# Patient Record
Sex: Female | Born: 1980 | Race: Black or African American | Hispanic: No | Marital: Single | Smoking: Never smoker
Health system: Southern US, Community
[De-identification: ages and names within clinical notes are randomized; demographics above are authoritative.]

## PROBLEM LIST (undated history)

## (undated) DIAGNOSIS — F32A Depression, unspecified: Secondary | ICD-10-CM

## (undated) DIAGNOSIS — C259 Malignant neoplasm of pancreas, unspecified: Secondary | ICD-10-CM

## (undated) DIAGNOSIS — G8929 Other chronic pain: Secondary | ICD-10-CM

## (undated) DIAGNOSIS — F329 Major depressive disorder, single episode, unspecified: Secondary | ICD-10-CM

## (undated) DIAGNOSIS — E7800 Pure hypercholesterolemia, unspecified: Secondary | ICD-10-CM

## (undated) DIAGNOSIS — E119 Type 2 diabetes mellitus without complications: Secondary | ICD-10-CM

## (undated) DIAGNOSIS — M1990 Unspecified osteoarthritis, unspecified site: Secondary | ICD-10-CM

## (undated) DIAGNOSIS — F419 Anxiety disorder, unspecified: Secondary | ICD-10-CM

## (undated) DIAGNOSIS — M797 Fibromyalgia: Secondary | ICD-10-CM

## (undated) DIAGNOSIS — I10 Essential (primary) hypertension: Secondary | ICD-10-CM

## (undated) DIAGNOSIS — K219 Gastro-esophageal reflux disease without esophagitis: Secondary | ICD-10-CM

## (undated) DIAGNOSIS — R040 Epistaxis: Secondary | ICD-10-CM

## (undated) HISTORY — DX: Depression, unspecified: F32.A

## (undated) HISTORY — DX: Malignant neoplasm of pancreas, unspecified: C25.9

## (undated) HISTORY — PX: PANCREATECTOMY: SHX1019

## (undated) HISTORY — PX: OTHER SURGICAL HISTORY: SHX169

## (undated) HISTORY — DX: Gastro-esophageal reflux disease without esophagitis: K21.9

## (undated) HISTORY — DX: Anxiety disorder, unspecified: F41.9

## (undated) HISTORY — DX: Fibromyalgia: M79.7

## (undated) HISTORY — DX: Pure hypercholesterolemia, unspecified: E78.00

## (undated) HISTORY — DX: Essential (primary) hypertension: I10

---
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
## 2017-06-11 ENCOUNTER — Ambulatory Visit: Payer: Self-pay | Admitting: Physician Assistant

## 2017-06-11 ENCOUNTER — Encounter: Payer: Self-pay | Admitting: Physician Assistant

## 2017-06-11 ENCOUNTER — Encounter (INDEPENDENT_AMBULATORY_CARE_PROVIDER_SITE_OTHER): Payer: Self-pay

## 2017-06-11 VITALS — BP 130/96 | HR 94 | Temp 97.9°F | Ht 61.0 in | Wt 242.0 lb

## 2017-06-11 DIAGNOSIS — L250 Unspecified contact dermatitis due to cosmetics: Secondary | ICD-10-CM

## 2017-06-11 DIAGNOSIS — F419 Anxiety disorder, unspecified: Secondary | ICD-10-CM

## 2017-06-11 DIAGNOSIS — R030 Elevated blood-pressure reading, without diagnosis of hypertension: Secondary | ICD-10-CM

## 2017-06-11 DIAGNOSIS — Z9109 Other allergy status, other than to drugs and biological substances: Secondary | ICD-10-CM

## 2017-06-11 NOTE — Congregational Nurse Program (Signed)
Congregational Nurse Program Note  Date of Encounter: 06/07/2017  Past Medical History: No past medical history on file.  Encounter Details:     CNP Questionnaire - 06/07/17 1311      Patient Demographics   Is this a new or existing patient? New   Patient is considered a/an Not Applicable   Race African-American/Black     Patient Assistance   Location of Patient Assistance [facility] [place]   Patient's financial/insurance status Low Income;Self-Pay (Uninsured)   Patient referred to apply for the following financial assistance Medicaid   Food insecurities addressed Referred to food bank or resource   Transportation assistance No   Assistance securing medications No   Educational health offerings Behavioral health;Chronic disease;Navigating the healthcare system     Encounter Details   Primary purpose of visit Chronic Illness/Condition Visit;Navigating the Healthcare System   Was an Emergency Department visit averted? Not Applicable   Does patient have a medical provider? No   Patient referred to [place] [facility]   Was a mental health screening completed? (GAINS tool) No   Does patient have dental issues? No   Does patient have vision issues? No   Does your patient have an abnormal blood pressure today? Yes   Since previous encounter, have you referred patient for abnormal blood pressure that resulted in a new diagnosis or medication change? No   Does your patient have an abnormal blood glucose today? No   Since previous encounter, have you referred patient for abnormal blood glucose that resulted in a new diagnosis or medication change? No   Was there a life-saving intervention made? No     New Client to the [facility]. Encountered client while doing blood pressure screenings at the [facility] in [place]. Client states she moved here from [place] and has not been able to get a primary care provider. She states she has little to no income and no health  insurance. She states she has not had any of her medications for about 6 months.  Discussed with client options for referral for primary medical care. She states due to location, [facility] her. Referral made and appointment secured for 06/11/17.  Client mentioned also some mental health issues, but would not discuss very much due to lack of privacy at the [facility].  Did let client know that we do have a Education officer, museum available and gave client contact information of RN so that she could call and schedule an appointment if she wanted for help with referral for other services such as mental health.  Encouraged client to keep appointment and contact information of the [facility] given to client.  Will follow as needed.

## 2017-06-11 NOTE — Progress Notes (Signed)
BP (!) 130/96 (BP Location: Left Arm, Patient Position: Sitting, Cuff Size: Large)   Pulse 94   Temp 97.9 F (36.6 C)   Ht 5\' 1"  (1.549 m)   Wt 242 lb (109.8 kg)   SpO2 99%   BMI 45.73 kg/m    Subjective:    Patient ID: Dominique Torres, female    DOB: 1981-04-06, 36 y.o.   MRN: [id number]  HPI: Dominique Torres is a 36 y.o. female presenting on 06/11/2017 for New Patient (Initial Visit) (pt just moved to [facility] from [place] a month ago. pt last seen PCP in [place] around January, 2018)   HPI  Chief Complaint  Patient presents with  . New Patient (Initial Visit)    pt just moved to [facility] from [place] a month ago. pt last seen PCP in [place] around January, 2018    Pt says she lost about 40 pounds and that has helped her bp.  She says she was never on medication for her bp  Pt states some stess recently with job and money issues.   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 Will plan to start pt screening at age 54.    Pt was on meds in the past that didn't work - Psychologist, clinical for her fibromyalgia.    Pt says she had allergies in the past but they have gotten worse since moving to [place].  Pt states rash on face started when she changed to a [place]onds cold cream.  She says it is improved a bit since she stopped it but it still is there some.   Relevant past medical, surgical, family and social history reviewed and updated as indicated. Interim medical history since our last visit reviewed. Allergies and medications reviewed and updated.    Current Outpatient Prescriptions:  .  fluticasone (FLONASE) 50 MCG/ACT nasal spray, Place into both nostrils daily., Disp: , Rfl:    Review of Systems  Constitutional: Negative for appetite change, chills, diaphoresis, fatigue, fever and unexpected weight change.  HENT: Negative for congestion, drooling, ear pain, facial swelling, hearing loss,  mouth sores, sneezing, sore throat, trouble swallowing and voice change.   Eyes: Negative for pain, discharge, redness, itching and visual disturbance.  Respiratory: Negative for cough, choking, shortness of breath and wheezing.   Cardiovascular: Negative for chest pain, palpitations and leg swelling.  Gastrointestinal: Negative for abdominal pain, blood in stool, constipation, diarrhea and vomiting.  Endocrine: Negative for cold intolerance, heat intolerance and polydipsia.  Genitourinary: Negative for decreased urine volume, dysuria and hematuria.  Musculoskeletal: Positive for arthralgias. Negative for back pain and gait problem.  Skin: Positive for rash.  Allergic/Immunologic: Positive for environmental allergies.  Neurological: Negative for seizures, syncope, light-headedness and headaches.  Hematological: Negative for adenopathy.  Psychiatric/Behavioral: Negative for agitation, dysphoric mood and suicidal ideas. The patient is nervous/anxious.     Per HPI unless specifically indicated above     Objective:    BP (!) 130/96 (BP Location: Left Arm, Patient Position: Sitting, Cuff Size: Large)   Pulse 94   Temp 97.9 F (36.6 C)   Ht 5\' 1"  (1.549 m)   Wt 242 lb (109.8 kg)   SpO2 99%   BMI 45.73 kg/m   Wt Readings from Last 3 Encounters:  06/11/17 242 lb (109.8 kg)    Physical Exam  Constitutional: She is oriented to person, place, and time. She  appears well-developed and well-nourished.  HENT:  Head: Normocephalic and atraumatic.  Right Ear: Hearing, tympanic membrane, external ear and ear canal normal.  Left Ear: Hearing, tympanic membrane, external ear and ear canal normal.  Nose: Nose normal.  Mouth/Throat: Uvula is midline and oropharynx is clear and moist. No oropharyngeal exudate.  Eyes: Pupils are equal, round, and reactive to light. Conjunctivae and EOM are normal.  Neck: Neck supple. No thyromegaly present.  Cardiovascular: Normal rate and regular rhythm.    Pulmonary/Chest: Effort normal and breath sounds normal. She has no wheezes.  Abdominal: Soft. Bowel sounds are normal. She exhibits no mass. There is no hepatosplenomegaly. There is no tenderness.  Musculoskeletal: She exhibits no edema.  Lymphadenopathy:    She has no cervical adenopathy.  Neurological: She is alert and oriented to person, place, and time. Gait normal.  Skin: Skin is warm and dry. Rash noted.  Rash on cheeks and a little bit of the forehead. No discrete lesions- mildly reddish with some hive like appearance  Psychiatric: She has a normal mood and affect. Her behavior is normal.  Vitals reviewed.   No results found for this or any previous visit.    Assessment & Plan:    Encounter Diagnoses  Name Primary?  Marland Kitchen Anxiety Yes  . Contact dermatitis due to cosmetics, unspecified contact dermatitis type   . Morbid obesity ([place])   . Elevated blood pressure reading   . Environmental allergies     -pt was given [facility] to call for counseling -Record request sent to previous PCP for recent labs, PAP report -pt counseled on allergies, can continue flonase, use claritin or similar as needed -counseled pt to avoid ponds or other cosmetics with fragrances, dyes.  Can use otc cortisone cream to affected area bid x 1 week.   -counseled pt to increase exercise/walking to 45-60 minutes at least 5 days/week to help bp, weight, anxiety -discussed screening- PAP not needed but q 3-5 years, will start colon screening age 35, mammogram age 58 or 65 (will revisit when the time comes).   -will await recent labs before ordering more -pt to follow up in one month to recheck bp, rash, records/labs.  Pt to RTO sooner prn

## 2017-07-12 ENCOUNTER — Ambulatory Visit: Payer: Self-pay | Admitting: Physician Assistant

## 2017-07-19 ENCOUNTER — Telehealth: Payer: Self-pay | Admitting: Student

## 2017-07-19 NOTE — Telephone Encounter (Signed)
LPN returning call to patient (pt left voicemail to be called back)  Pt is wanting help with anxiety. Pt states feeling like passing out, like her throat is closing, and is having headaches. pt states anxiety has been worsening and wants to know if [facility] can treat her or if she could be referred to some place that could help.  Pt was informed that at last OV on 06-11-17 she given card for [facility] and was recommended by PA to call them for counseling. Pt states she still has not called.  Pt was given [facility] number again as well as [facility]'s walk-in information. Pt was again instructed to call for counseling. Pt verbalized understanding.  Pt cancelled f/u appointment that was scheduled on 07-12-17 with [facility]. Pt has been rescheduled for 07-30-17.

## 2017-07-30 ENCOUNTER — Ambulatory Visit: Payer: Self-pay | Admitting: Physician Assistant

## 2017-07-30 ENCOUNTER — Other Ambulatory Visit (HOSPITAL_COMMUNITY)
Admission: RE | Admit: 2017-07-30 | Discharge: 2017-07-30 | Disposition: A | Discharge status: Home / Self Care | Payer: Self-pay | Source: Ambulatory Visit | Attending: Physician Assistant | Admitting: Physician Assistant

## 2017-07-30 ENCOUNTER — Encounter: Payer: Self-pay | Admitting: Physician Assistant

## 2017-07-30 VITALS — BP 134/82 | HR 97 | Temp 97.9°F | Ht 61.0 in | Wt 245.2 lb

## 2017-07-30 DIAGNOSIS — R030 Elevated blood-pressure reading, without diagnosis of hypertension: Secondary | ICD-10-CM

## 2017-07-30 DIAGNOSIS — F419 Anxiety disorder, unspecified: Secondary | ICD-10-CM | POA: Insufficient documentation

## 2017-07-30 DIAGNOSIS — Z130 Encounter for screening for diseases of the blood and blood-forming organs and certain disorders involving the immune mechanism: Secondary | ICD-10-CM | POA: Insufficient documentation

## 2017-07-30 DIAGNOSIS — Z13220 Encounter for screening for lipoid disorders: Secondary | ICD-10-CM

## 2017-07-30 DIAGNOSIS — Z131 Encounter for screening for diabetes mellitus: Secondary | ICD-10-CM

## 2017-07-30 DIAGNOSIS — G8929 Other chronic pain: Secondary | ICD-10-CM

## 2017-07-30 DIAGNOSIS — Z9109 Other allergy status, other than to drugs and biological substances: Secondary | ICD-10-CM

## 2017-07-30 LAB — CBC WITH DIFFERENTIAL/PLATELET
BASOS PCT: 0 %
Basophils Absolute: 0 10*3/uL (ref 0.0–0.1)
EOS ABS: 0.1 10*3/uL (ref 0.0–0.7)
Eosinophils Relative: 1 %
HEMATOCRIT: 38.5 % (ref 36.0–46.0)
Hemoglobin: 13.6 g/dL (ref 12.0–15.0)
LYMPHS ABS: 3.5 10*3/uL (ref 0.7–4.0)
Lymphocytes Relative: 39 %
MCH: 30.6 pg (ref 26.0–34.0)
MCHC: 35.3 g/dL (ref 30.0–36.0)
MCV: 86.5 fL (ref 78.0–100.0)
MONO ABS: 0.4 10*3/uL (ref 0.1–1.0)
Monocytes Relative: 5 %
Neutro Abs: 5 10*3/uL (ref 1.7–7.7)
Neutrophils Relative %: 55 %
Platelets: 313 10*3/uL (ref 150–400)
RBC: 4.45 MIL/uL (ref 3.87–5.11)
RDW: 12.9 % (ref 11.5–15.5)
WBC: 8.9 10*3/uL (ref 4.0–10.5)

## 2017-07-30 LAB — COMPREHENSIVE METABOLIC PANEL
ALT: 12 U/L — ABNORMAL LOW (ref 14–54)
ANION GAP: 11 (ref 5–15)
AST: 17 U/L (ref 15–41)
Albumin: 3.8 g/dL (ref 3.5–5.0)
Alkaline Phosphatase: 102 U/L (ref 38–126)
BILIRUBIN TOTAL: 0.5 mg/dL (ref 0.3–1.2)
BUN: 8 mg/dL (ref 6–20)
CALCIUM: 9.2 mg/dL (ref 8.9–10.3)
CHLORIDE: 99 mmol/L — AB (ref 101–111)
CO2: 28 mmol/L (ref 22–32)
CREATININE: 0.61 mg/dL (ref 0.44–1.00)
GFR calc Af Amer: >60 mL/min (ref >60)
GLUCOSE: 112 mg/dL — AB (ref 65–99)
Potassium: 3 mmol/L — ABNORMAL LOW (ref 3.5–5.1)
Sodium: 138 mmol/L (ref 135–145)
Total Protein: 8.6 g/dL — ABNORMAL HIGH (ref 6.5–8.1)

## 2017-07-30 LAB — HEMOGLOBIN A1C
HEMOGLOBIN A1C: 5.7 % — AB (ref 4.8–5.6)
MEAN PLASMA GLUCOSE: 116.89 mg/dL

## 2017-07-30 LAB — LIPID PANEL
CHOLESTEROL: 242 mg/dL — AB (ref 0–200)
HDL: 39 mg/dL — AB (ref >40)
LDL Cholesterol: 186 mg/dL — ABNORMAL HIGH (ref 0–99)
Total CHOL/HDL Ratio: 6.2 RATIO
Triglycerides: 85 mg/dL (ref <150)
VLDL: 17 mg/dL (ref 0–40)

## 2017-07-30 LAB — TSH: TSH: 1.608 u[IU]/mL (ref 0.350–4.500)

## 2017-07-30 NOTE — Progress Notes (Signed)
BP 134/82 (BP Location: Left Arm, Patient Position: Sitting, Cuff Size: Large)   Pulse 97   Temp 97.9 F (36.6 C)   Ht 5\' 1"  (1.549 m)   Wt 245 lb 4 oz (111.2 kg)   SpO2 97%   BMI 46.34 kg/m    Subjective:    Patient ID: Dominique Torres, female    DOB: [birth year], 36 y.o.   MRN: [id number]  HPI: Dominique Torres is a 36 y.o. female presenting on 07/30/2017 for Hypertension and Rash   HPI  Record request to previous PCP- no reply  Pt States anxiety worse past 2 wk.  Pt joined support group at [facility].  Pt states hx fibromyalgia in the past and states pain "really bad" recently.   When asked what she took for that in the past pt states she has many drug allergies and many meds didn't work.  She says she used muscle relaxers chronically.  Relevant past medical, surgical, family and social history reviewed and updated as indicated. Interim medical history since our last visit reviewed. Allergies and medications reviewed and updated.   Current Outpatient Prescriptions:  .  fluticasone (FLONASE) 50 MCG/ACT nasal spray, Place into both nostrils daily., Disp: , Rfl:  .  OVER THE COUNTER MEDICATION, Lobelia Liquid for anxiety and sleep, Disp: , Rfl:  .  VALERIAN PO, Take 500 mg by mouth daily., Disp: , Rfl:    Review of Systems  Constitutional: Positive for fatigue. Negative for appetite change, chills, diaphoresis, fever and unexpected weight change.  HENT: Negative for congestion, dental problem, drooling, ear pain, facial swelling, hearing loss, mouth sores, sneezing, sore throat, trouble swallowing and voice change.   Eyes: Negative for pain, discharge, redness, itching and visual disturbance.  Respiratory: Negative for cough, choking, shortness of breath and wheezing.   Cardiovascular: Negative for chest pain, palpitations and leg swelling.  Gastrointestinal: Negative for abdominal pain, blood in stool, constipation, diarrhea and vomiting.  Endocrine: Positive for cold  intolerance and heat intolerance. Negative for polydipsia.  Genitourinary: Negative for decreased urine volume, dysuria and hematuria.  Musculoskeletal: Positive for arthralgias and gait problem. Negative for back pain.  Skin: Negative for rash.  Allergic/Immunologic: Negative for environmental allergies.  Neurological: Positive for headaches. Negative for seizures, syncope and light-headedness.  Hematological: Negative for adenopathy.  Psychiatric/Behavioral: Positive for dysphoric mood. Negative for agitation and suicidal ideas. The patient is nervous/anxious.     Per HPI unless specifically indicated above     Objective:    BP 134/82 (BP Location: Left Arm, Patient Position: Sitting, Cuff Size: Large)   Pulse 97   Temp 97.9 F (36.6 C)   Ht 5\' 1"  (1.549 m)   Wt 245 lb 4 oz (111.2 kg)   SpO2 97%   BMI 46.34 kg/m   Wt Readings from Last 3 Encounters:  07/30/17 245 lb 4 oz (111.2 kg)  06/11/17 242 lb (109.8 kg)    Physical Exam  Constitutional: She is oriented to person, place, and time. She appears well-developed and well-nourished.  HENT:  Head: Normocephalic and atraumatic.  Neck: Neck supple.  Cardiovascular: Normal rate and regular rhythm.   Pulmonary/Chest: Effort normal and breath sounds normal.  Abdominal: Soft. Bowel sounds are normal. She exhibits no mass. There is no hepatosplenomegaly. There is no tenderness.  Musculoskeletal: She exhibits no edema.  Lymphadenopathy:    She has no cervical adenopathy.  Neurological: She is alert and oriented to person, place, and time.  Skin: Skin is warm and dry.  Psychiatric: She has a normal mood and affect. Her behavior is normal.  Vitals reviewed.   No results found for this or any previous visit.    Assessment & Plan:   Encounter Diagnoses  Name Primary?  Marland Kitchen Anxiety Yes  . Elevated blood pressure reading   . Environmental allergies   . Morbid obesity ([place])   . Other chronic pain   . Screening cholesterol level    . Screening, anemia, deficiency, iron   . Screening for diabetes mellitus     -Order baseline labs -Re-send record request for last PAP results  -Refer for chronic pain -monitor BP [facility] for anxiety -pt was given [facility] discount application -follow up 1 month  RTO sooner prn

## 2017-07-31 ENCOUNTER — Other Ambulatory Visit: Payer: Self-pay | Admitting: Physician Assistant

## 2017-07-31 DIAGNOSIS — E876 Hypokalemia: Secondary | ICD-10-CM

## 2017-08-07 ENCOUNTER — Other Ambulatory Visit (HOSPITAL_COMMUNITY)
Admission: RE | Admit: 2017-08-07 | Discharge: 2017-08-07 | Disposition: A | Discharge status: Home / Self Care | Payer: Self-pay | Source: Ambulatory Visit | Attending: Physician Assistant | Admitting: Physician Assistant

## 2017-08-07 DIAGNOSIS — E876 Hypokalemia: Secondary | ICD-10-CM | POA: Insufficient documentation

## 2017-08-07 LAB — POTASSIUM: Potassium: 2.9 mmol/L — ABNORMAL LOW (ref 3.5–5.1)

## 2017-08-12 ENCOUNTER — Other Ambulatory Visit: Payer: Self-pay | Admitting: Physician Assistant

## 2017-08-12 DIAGNOSIS — E876 Hypokalemia: Secondary | ICD-10-CM

## 2017-08-12 MED ORDER — POTASSIUM CHLORIDE ER 10 MEQ PO TBCR: 10.0000 meq | EXTENDED_RELEASE_TABLET | Freq: Two times a day (BID) | ORAL | 0 refills | Status: DC

## 2017-08-21 ENCOUNTER — Other Ambulatory Visit: Payer: Self-pay | Admitting: Physician Assistant

## 2017-08-21 ENCOUNTER — Other Ambulatory Visit (HOSPITAL_COMMUNITY)
Admission: RE | Admit: 2017-08-21 | Discharge: 2017-08-21 | Disposition: A | Discharge status: Home / Self Care | Payer: Self-pay | Source: Ambulatory Visit | Attending: Physician Assistant | Admitting: Physician Assistant

## 2017-08-21 DIAGNOSIS — E876 Hypokalemia: Secondary | ICD-10-CM

## 2017-08-21 LAB — POTASSIUM: POTASSIUM: 3.1 mmol/L — AB (ref 3.5–5.1)

## 2017-08-21 MED ORDER — POTASSIUM CHLORIDE ER 10 MEQ PO TBCR: 10.0000 meq | EXTENDED_RELEASE_TABLET | Freq: Two times a day (BID) | ORAL | 0 refills | Status: DC

## 2017-09-03 ENCOUNTER — Ambulatory Visit: Payer: Self-pay | Admitting: Physician Assistant

## 2017-09-06 ENCOUNTER — Other Ambulatory Visit (HOSPITAL_COMMUNITY)
Admission: RE | Admit: 2017-09-06 | Discharge: 2017-09-06 | Disposition: A | Discharge status: Home / Self Care | Payer: Self-pay | Source: Ambulatory Visit | Attending: Physician Assistant | Admitting: Physician Assistant

## 2017-09-06 ENCOUNTER — Encounter: Payer: Self-pay | Admitting: Physician Assistant

## 2017-09-06 ENCOUNTER — Ambulatory Visit: Payer: Self-pay | Admitting: Physician Assistant

## 2017-09-06 VITALS — BP 156/88 | HR 104 | Temp 97.9°F | Wt 249.5 lb

## 2017-09-06 DIAGNOSIS — E785 Hyperlipidemia, unspecified: Secondary | ICD-10-CM

## 2017-09-06 DIAGNOSIS — R030 Elevated blood-pressure reading, without diagnosis of hypertension: Secondary | ICD-10-CM

## 2017-09-06 DIAGNOSIS — G8929 Other chronic pain: Secondary | ICD-10-CM

## 2017-09-06 DIAGNOSIS — E876 Hypokalemia: Secondary | ICD-10-CM

## 2017-09-06 DIAGNOSIS — R7303 Prediabetes: Secondary | ICD-10-CM

## 2017-09-06 LAB — POTASSIUM: POTASSIUM: 3.1 mmol/L — AB (ref 3.5–5.1)

## 2017-09-06 MED ORDER — POTASSIUM CHLORIDE ER 10 MEQ PO TBCR: 10.0000 meq | EXTENDED_RELEASE_TABLET | Freq: Two times a day (BID) | ORAL | 0 refills | Status: DC

## 2017-09-06 MED ORDER — ATORVASTATIN CALCIUM 10 MG PO TABS: 10.0000 mg | ORAL_TABLET | Freq: Every day | ORAL | 1 refills | Status: DC

## 2017-09-06 NOTE — Patient Instructions (Addendum)
-turn in [facility] application -[facility] for PAP [phone]) -Continue potassium -nurse will call you in 2 weeks to get labs drawn   Fat and Cholesterol Restricted Diet Getting too much fat and cholesterol in your diet may cause health problems. Following this diet helps keep your fat and cholesterol at normal levels. This can keep you from getting sick. What types of fat should I choose?  Choose monosaturated and polyunsaturated fats. These are found in foods such as olive oil, canola oil, flaxseeds, walnuts, almonds, and seeds.  Eat more omega-3 fats. Good choices include salmon, mackerel, sardines, tuna, flaxseed oil, and ground flaxseeds.  Limit saturated fats. These are in animal products such as meats, butter, and cream. They can also be in plant products such as palm oil, palm kernel oil, and coconut oil.  Avoid foods with partially hydrogenated oils in them. These contain trans fats. Examples of foods that have trans fats are stick margarine, some tub margarines, cookies, crackers, and other baked goods. What general guidelines do I need to follow?  Check food labels. Look for the words "trans fat" and "saturated fat."  When preparing a meal: ? Fill half of your plate with vegetables and green salads. ? Fill one fourth of your plate with whole grains. Look for the word "whole" as the first word in the ingredient list. ? Fill one fourth of your plate with lean protein foods.  Eat more foods that have fiber, like apples, carrots, beans, peas, and barley.  Eat more home-cooked foods. Eat less at restaurants and buffets.  Limit or avoid alcohol.  Limit foods high in starch and sugar.  Limit fried foods.  Cook foods without frying them. Baking, boiling, grilling, and broiling are all great options.  Lose weight if you are overweight. Losing even a small amount of weight can help your overall health. It can also help prevent diseases such as diabetes and  heart disease. What foods can I eat? Grains Whole grains, such as whole wheat or whole grain breads, crackers, cereals, and pasta. Unsweetened oatmeal, bulgur, barley, quinoa, or brown rice. Corn or whole wheat flour tortillas. Vegetables Fresh or frozen vegetables (raw, steamed, roasted, or grilled). Green salads. Fruits All fresh, canned (in natural juice), or frozen fruits. Meat and Other Protein Products Ground beef (85% or leaner), grass-fed beef, or beef trimmed of fat. Skinless chicken or [place]. Ground chicken or [place]. Pork trimmed of fat. All fish and seafood. Eggs. Dried beans, peas, or lentils. Unsalted nuts or seeds. Unsalted canned or dry beans. Dairy Low-fat dairy products, such as skim or 1% milk, 2% or reduced-fat cheeses, low-fat ricotta or cottage cheese, or plain low-fat yogurt. Fats and Oils Tub margarines without trans fats. Light or reduced-fat mayonnaise and salad dressings. Avocado. Olive, canola, sesame, or safflower oils. Natural peanut or almond butter (choose ones without added sugar and oil). The items listed above may not be a complete list of recommended foods or beverages. Contact your dietitian for more options. What foods are not recommended? Grains White bread. White pasta. White rice. Cornbread. Bagels, pastries, and croissants. Crackers that contain trans fat. Vegetables White potatoes. Corn. Creamed or fried vegetables. Vegetables in a cheese sauce. Fruits Dried fruits. Canned fruit in light or heavy syrup. Fruit juice. Meat and Other Protein Products Fatty cuts of meat. Ribs, chicken wings, bacon, sausage, bologna, salami, chitterlings, fatback, hot dogs, bratwurst, and packaged luncheon meats. Liver and organ meats. Dairy Whole or 2% milk, cream, half-and-half, and  cream cheese. Whole milk cheeses. Whole-fat or sweetened yogurt. Full-fat cheeses. Nondairy creamers and whipped toppings. Processed cheese, cheese spreads, or cheese curds. Sweets and  Desserts Corn syrup, sugars, honey, and molasses. Candy. Jam and jelly. Syrup. Sweetened cereals. Cookies, pies, cakes, donuts, muffins, and ice cream. Fats and Oils Butter, stick margarine, lard, shortening, ghee, or bacon fat. Coconut, palm kernel, or palm oils. Beverages Alcohol. Sweetened drinks (such as sodas, lemonade, and fruit drinks or punches). The items listed above may not be a complete list of foods and beverages to avoid. Contact your dietitian for more information. This information is not intended to replace advice given to you by your health care provider. Make sure you discuss any questions you have with your health care provider. Document Released: 04/16/2012 Document Revised: 06/22/2016 Document Reviewed: 01/15/2014 Elsevier Interactive Patient Education  [employer].

## 2017-09-06 NOTE — Progress Notes (Signed)
BP (!) 156/88   Pulse (!) 104   Temp 97.9 F (36.6 C)   Wt 249 lb 8 oz (113.2 kg)   SpO2 98%   BMI 47.14 kg/m    Subjective:    Patient ID: Dominique Torres, female    DOB: [birth year], 36 y.o.   MRN: [id number]  HPI: Dominique Torres is a 36 y.o. female presenting on 09/06/2017 for Follow-up and Nausea (pt states decrease appetite, weight gain, and constipation. pt believes it is due to potassium)   HPI  Chief Complaint  Patient presents with  . Follow-up  . Nausea    pt states decrease appetite, weight gain, and constipation. pt believes it is due to potassium    Pt did not contact [facility] for anxiety.  She "heard terrible things about that place".  She is trying to get in with somewhere else. She joined a trauma support group that helps some.   Pt stopped the supplements 08/13/17 when instructed to do so.  Pt did not get [facility] discount application turned in (for chronic pain referral)  Pt says that she now has [employer] Medicaid  Records back from her previous PCP- PAP not done 2017 when pelvic exam was done (pt says it was because she had yeast infection)  Relevant past medical, surgical, family and social history reviewed and updated as indicated. Interim medical history since our last visit reviewed. Allergies and medications reviewed and updated.   Current Outpatient Medications:  .  potassium chloride (K-DUR) 10 MEQ tablet, Take 1 tablet (10 mEq total) by mouth 2 (two) times daily., Disp: 28 tablet, Rfl: 0   Review of Systems  Constitutional: Positive for appetite change, fatigue and unexpected weight change. Negative for chills, diaphoresis and fever.  HENT: Negative for congestion, dental problem, drooling, ear pain, facial swelling, hearing loss, mouth sores, sneezing, sore throat, trouble swallowing and voice change.   Eyes: Negative for pain, discharge, redness, itching and visual disturbance.  Respiratory: Negative for cough, choking, shortness of breath  and wheezing.   Cardiovascular: Negative for chest pain, palpitations and leg swelling.  Gastrointestinal: Positive for constipation. Negative for abdominal pain, blood in stool, diarrhea and vomiting.  Endocrine: Positive for cold intolerance and heat intolerance. Negative for polydipsia.  Genitourinary: Negative for decreased urine volume, dysuria and hematuria.  Musculoskeletal: Positive for arthralgias, back pain and gait problem.  Skin: Negative for rash.  Allergic/Immunologic: Negative for environmental allergies.  Neurological: Negative for seizures, syncope, light-headedness and headaches.  Hematological: Negative for adenopathy.  Psychiatric/Behavioral: Positive for dysphoric mood. Negative for agitation and suicidal ideas. The patient is nervous/anxious.     Per HPI unless specifically indicated above     Objective:    BP (!) 156/88   Pulse (!) 104   Temp 97.9 F (36.6 C)   Wt 249 lb 8 oz (113.2 kg)   SpO2 98%   BMI 47.14 kg/m   Wt Readings from Last 3 Encounters:  09/06/17 249 lb 8 oz (113.2 kg)  07/30/17 245 lb 4 oz (111.2 kg)  06/11/17 242 lb (109.8 kg)    Physical Exam  Constitutional: She is oriented to person, place, and time. She appears well-developed and well-nourished.  HENT:  Head: Normocephalic and atraumatic.  Neck: Neck supple.  Cardiovascular: Normal rate and regular rhythm.  Pulmonary/Chest: Effort normal and breath sounds normal.  Abdominal: Soft. Bowel sounds are normal. She exhibits no mass. There is no hepatosplenomegaly. There is no tenderness.  Musculoskeletal: She exhibits no edema.  Lymphadenopathy:    She has no cervical adenopathy.  Neurological: She is alert and oriented to person, place, and time.  Skin: Skin is warm and dry.  Psychiatric: She has a normal mood and affect. Her behavior is normal.  Vitals reviewed.   Results for orders placed or performed during the hospital encounter of 09/06/17  Potassium  Result Value Ref Range    Potassium 3.1 (L) 3.5 - 5.1 mmol/L      Assessment & Plan:   Encounter Diagnoses  Name Primary?  . Hypokalemia Yes  . Hyperlipidemia, unspecified hyperlipidemia type   . Elevated blood pressure reading   . Morbid obesity ([place])     -reviewed labs with pt -Pt to call gyn for PAP (she has FP medicaid) -pt counseled to Turn in [employer] application for pain referral -pt to Continue potassium -will recheck potassium in 2 wk -will sign up pt for [facility] -( potassium and lipitor) -pt counseled about weight management and prediabetes -pt to follow up 1 month to recheck bp (it was good at previous OV).  RTO sooner prn

## 2017-09-13 ENCOUNTER — Telehealth: Payer: Self-pay

## 2017-09-13 NOTE — Telephone Encounter (Signed)
Telephone call today to remind patient needs to get potassium rechecked next week. Patient states she has not yet started taking potassium due to cannot afford it, even with coupon provided at last visit. Will bring papers for [facility] ASAP so can get med through [facility]. Discussed with PA, with patient given instructions that her potassium level is life-threatening, and she needs to get blood drawn today, and start taking potassium as ordered. Patient states she cannot go today, due to fibromyalgia pain, but that she will be able to go tomorrow. Patient states understanding of importance of getting lab drawn ASAP and starting potassium as ordered.

## 2017-09-14 ENCOUNTER — Other Ambulatory Visit (HOSPITAL_COMMUNITY)
Admission: RE | Admit: 2017-09-14 | Discharge: 2017-09-14 | Disposition: A | Discharge status: Home / Self Care | Payer: Self-pay | Source: Ambulatory Visit | Attending: Physician Assistant | Admitting: Physician Assistant

## 2017-09-14 ENCOUNTER — Emergency Department (HOSPITAL_COMMUNITY)
Admission: EM | Admit: 2017-09-14 | Discharge: 2017-09-14 | Disposition: A | Discharge status: Home / Self Care | Payer: Self-pay | Attending: Emergency Medicine | Admitting: Emergency Medicine

## 2017-09-14 ENCOUNTER — Other Ambulatory Visit: Payer: Self-pay

## 2017-09-14 ENCOUNTER — Encounter (HOSPITAL_COMMUNITY): Payer: Self-pay

## 2017-09-14 DIAGNOSIS — E876 Hypokalemia: Secondary | ICD-10-CM | POA: Insufficient documentation

## 2017-09-14 DIAGNOSIS — Z79899 Other long term (current) drug therapy: Secondary | ICD-10-CM | POA: Insufficient documentation

## 2017-09-14 DIAGNOSIS — I10 Essential (primary) hypertension: Secondary | ICD-10-CM | POA: Insufficient documentation

## 2017-09-14 LAB — BASIC METABOLIC PANEL
Anion gap: 7 (ref 5–15)
BUN: 14 mg/dL (ref 6–20)
CHLORIDE: 101 mmol/L (ref 101–111)
CO2: 30 mmol/L (ref 22–32)
CREATININE: 0.73 mg/dL (ref 0.44–1.00)
Calcium: 9.3 mg/dL (ref 8.9–10.3)
GFR calc Af Amer: >60 mL/min (ref >60)
GFR calc non Af Amer: >60 mL/min (ref >60)
Glucose, Bld: 107 mg/dL — ABNORMAL HIGH (ref 65–99)
Potassium: 3 mmol/L — ABNORMAL LOW (ref 3.5–5.1)
SODIUM: 138 mmol/L (ref 135–145)

## 2017-09-14 LAB — POTASSIUM: POTASSIUM: 2.7 mmol/L — AB (ref 3.5–5.1)

## 2017-09-14 MED ORDER — POTASSIUM CHLORIDE CRYS ER 20 MEQ PO TBCR
20.0000 meq | EXTENDED_RELEASE_TABLET | Freq: Once | ORAL | Status: AC
  Administered 2017-09-14: 20 meq via ORAL
  Filled 2017-09-14: qty 1

## 2017-09-14 MED ORDER — POTASSIUM CHLORIDE ER 10 MEQ PO TBCR: 10.0000 meq | EXTENDED_RELEASE_TABLET | Freq: Two times a day (BID) | ORAL | 0 refills | Status: DC

## 2017-09-14 NOTE — ED Triage Notes (Signed)
Pt reports had potassium checked today and it was 2.7.  Was instructed to come to ER by [facility].  Pt says they have been monitoring her potassium for the past 3 weeks because it has been low.  Reports she started taking potassium 71meq bid 3 weeks ago.  Pt c/o nausea and extreme fatigue.  Pt reports she missed one week of potassium because she didn't have the money to get the prescription filled.  Provider has been writing the prescription one week, then for 2 weeks, and recently wrote for another 2 weeks.

## 2017-09-14 NOTE — Discharge Instructions (Signed)
Your potassium now is 3.0.   Recommend potassium 10 mEq 2 tablets in the morning and 2 tablets in the evening for 3 days.  Then return to 1 tablet twice a day.  Follow-up with your primary doctor.

## 2017-09-14 NOTE — ED Provider Notes (Signed)
[facility] EMERGENCY DEPARTMENT Provider Note   CSN: [id number] Arrival date & time: 09/14/17  1637     History   Chief Complaint Chief Complaint  Patient presents with  . Abnormal Lab    HPI Dominique Torres is a 36 y.o. female.  Patient presents with a concern regarding low potassium.  She has a history of hypokalemia of uncertain etiology.  She has been taking potassium replacement for 3 weeks, but not the last several days secondary to not getting her prescription refilled.  ROS pos for fatigue and nausea.  No chest pain, dyspnea, motor or sensory deficits.  K earlier today was 2.7      Past Medical History:  Diagnosis Date  . Anxiety   . Fibromyalgia   . GERD (gastroesophageal reflux disease)   . Hypertension     There are no active problems to display for this patient.   Past Surgical History:  Procedure Laterality Date  . exploratory obgyn surgery      OB History    No data available       Home Medications    Prior to Admission medications   Medication Sig Start Date End Date Taking? Authorizing Provider  atorvastatin (LIPITOR) 10 MG tablet Take 1 tablet (10 mg total) daily by mouth. Patient not taking: Reported on 09/14/2017 09/06/17   Soyla Dryer, PA-C  potassium chloride (K-DUR) 10 MEQ tablet Take 1 tablet (10 mEq total) 2 (two) times daily by mouth. 09/14/17   Nat Christen, MD  potassium chloride (K-DUR) 10 MEQ tablet Take 1 tablet (10 mEq total) 2 (two) times daily by mouth. 09/14/17   Nat Christen, MD    Family History Family History  Problem Relation Age of Onset  . Diabetes Father   . Cancer Father        Hx colon cancer  . Hypertension Maternal Aunt   . Hyperlipidemia Maternal Aunt   . Diabetes Paternal Uncle   . Diabetes Paternal Grandfather     Social History Social History   Tobacco Use  . Smoking status: Never Smoker  . Smokeless tobacco: Never Used  Substance Use Topics  . Alcohol use: No  . Drug use: No      Allergies   Banana; Biaxin [clarithromycin]; Ciprofloxacin; Cymbalta [duloxetine hcl]; Erythromycin; Gluten meal; Lyrica [pregabalin]; Other; Sulfa antibiotics; and Tramadol   Review of Systems Review of Systems  All other systems reviewed and are negative.    Physical Exam Updated Vital Signs BP (!) 142/91   Pulse 93   Temp 98.7 F (37.1 C)   Resp 17   Ht 5\' 2"  (1.575 m)   Wt 112.9 kg (249 lb)   SpO2 98%   BMI 45.54 kg/m   Physical Exam  Constitutional: She is oriented to person, place, and time. She appears well-developed and well-nourished.  HENT:  Head: Normocephalic and atraumatic.  Eyes: Conjunctivae are normal.  Neck: Neck supple.  Cardiovascular: Normal rate and regular rhythm.  Pulmonary/Chest: Effort normal and breath sounds normal.  Abdominal: Soft. Bowel sounds are normal.  Musculoskeletal: Normal range of motion.  Neurological: She is alert and oriented to person, place, and time.  Skin: Skin is warm and dry.  Psychiatric: She has a normal mood and affect. Her behavior is normal.  Nursing note and vitals reviewed.    ED Treatments / Results  Labs (all labs ordered are listed, but only abnormal results are displayed) Labs Reviewed  BASIC METABOLIC PANEL - Abnormal; Notable for the following  components:      Result Value   Potassium 3.0 (*)    Glucose, Bld 107 (*)    All other components within normal limits    EKG  EKG Interpretation None       Radiology No results found.  Procedures Procedures (including critical care time)  Medications Ordered in ED Medications  potassium chloride SA (K-DUR,KLOR-CON) CR tablet 20 mEq (20 mEq Oral Given 09/14/17 1827)     Initial Impression / Assessment and Plan / ED Course  I have reviewed the triage vital signs and the nursing notes.  Pertinent labs & imaging results that were available during my care of the patient were reviewed by me and considered in my medical decision making (see  chart for details).     Physical exam was within normal limits.  Potassium 3.0.  Will replace potassium orally.  Patient instructed to take potassium 40 mEq daily for 3 days; then 20 mEq once daily.  She will follow-up with her primary care doctor.  Final Clinical Impressions(s) / ED Diagnoses   Final diagnoses:  Hypokalemia    ED Discharge Orders        Ordered    potassium chloride (K-DUR) 10 MEQ tablet  2 times daily     09/14/17 1916    potassium chloride (K-DUR) 10 MEQ tablet  2 times daily     09/14/17 1916       Nat Christen, MD 09/14/17 2138

## 2017-09-17 ENCOUNTER — Telehealth: Payer: Self-pay | Admitting: Student

## 2017-09-17 ENCOUNTER — Other Ambulatory Visit: Payer: Self-pay | Admitting: Physician Assistant

## 2017-09-17 DIAGNOSIS — E876 Hypokalemia: Secondary | ICD-10-CM

## 2017-09-17 NOTE — Telephone Encounter (Signed)
-----   Message from Soyla Dryer, [place] sent at 09/17/2017 10:49 AM EST ----- Please call pt and reinforce the importance of taking potassium rx given in ER.  Recheck lab/bloodtest next Monday, Nov 26

## 2017-09-17 NOTE — Telephone Encounter (Signed)
Called patient to ensure she has purchased her potassium and is taking now. Pt states she purchased it on Thursday last week (09-17-17) and has been taking it since.  Pt mentioned she was told to go to [facility] on Friday due to her low potassium (pt had critical K+ at 2.7 on 09-14-17)  and felt it was "a complete waste of my time". pt states when she got to the ER her potassium was back to "normal" since she has already started taking her potassium.  Pt was reinsured that low potassium is very life threatening and should not be taken lightly. When trying to explain this to the patient she stated she is "entitled to feel that way" regarding to her ER visit being a " waste of time", and stated that LPN was being rude to her.  Pt states that she would have to pay for her ER visits and does not have income. Pt was informed that there is financial assistance that if approved could help pay for her hospital bills. Pt wants [facility] Discount Application mailed to her address on file.  Pt was reminded to make sure to take potassium daily and to go to [facility] for recheck on Monday, Nov. 26, 2018. Pt verbalized understanding.

## 2017-09-24 ENCOUNTER — Other Ambulatory Visit (HOSPITAL_COMMUNITY)
Admission: RE | Admit: 2017-09-24 | Discharge: 2017-09-24 | Disposition: A | Discharge status: Home / Self Care | Payer: Self-pay | Source: Ambulatory Visit | Attending: Physician Assistant | Admitting: Physician Assistant

## 2017-09-24 ENCOUNTER — Other Ambulatory Visit: Payer: Self-pay | Admitting: Physician Assistant

## 2017-09-24 DIAGNOSIS — E876 Hypokalemia: Secondary | ICD-10-CM | POA: Insufficient documentation

## 2017-09-24 LAB — POTASSIUM: POTASSIUM: 3.2 mmol/L — AB (ref 3.5–5.1)

## 2017-09-24 MED ORDER — POTASSIUM CHLORIDE ER 10 MEQ PO TBCR: 10.0000 meq | EXTENDED_RELEASE_TABLET | Freq: Two times a day (BID) | ORAL | 0 refills | Status: DC

## 2017-09-24 MED ORDER — POTASSIUM CHLORIDE ER 10 MEQ PO TBCR: EXTENDED_RELEASE_TABLET | ORAL | 0 refills | Status: DC

## 2017-10-04 ENCOUNTER — Other Ambulatory Visit: Payer: Self-pay | Admitting: Women's Health

## 2017-10-08 ENCOUNTER — Ambulatory Visit: Payer: Self-pay | Admitting: Physician Assistant

## 2017-10-11 ENCOUNTER — Ambulatory Visit: Payer: Self-pay | Admitting: Physician Assistant

## 2017-10-17 ENCOUNTER — Other Ambulatory Visit: Payer: Self-pay | Admitting: Physician Assistant

## 2017-10-17 ENCOUNTER — Ambulatory Visit: Payer: Self-pay | Admitting: Physician Assistant

## 2017-10-17 ENCOUNTER — Other Ambulatory Visit (HOSPITAL_COMMUNITY)
Admission: RE | Admit: 2017-10-17 | Discharge: 2017-10-17 | Disposition: A | Discharge status: Home / Self Care | Payer: Self-pay | Source: Ambulatory Visit | Attending: Physician Assistant | Admitting: Physician Assistant

## 2017-10-17 ENCOUNTER — Encounter: Payer: Self-pay | Admitting: Physician Assistant

## 2017-10-17 VITALS — BP 138/90 | HR 94 | Temp 97.9°F | Ht 61.0 in | Wt 247.0 lb

## 2017-10-17 DIAGNOSIS — E876 Hypokalemia: Secondary | ICD-10-CM | POA: Insufficient documentation

## 2017-10-17 DIAGNOSIS — I10 Essential (primary) hypertension: Secondary | ICD-10-CM | POA: Insufficient documentation

## 2017-10-17 DIAGNOSIS — E785 Hyperlipidemia, unspecified: Secondary | ICD-10-CM

## 2017-10-17 LAB — BASIC METABOLIC PANEL
ANION GAP: 11 (ref 5–15)
BUN: 9 mg/dL (ref 6–20)
CALCIUM: 9.4 mg/dL (ref 8.9–10.3)
CHLORIDE: 99 mmol/L — AB (ref 101–111)
CO2: 29 mmol/L (ref 22–32)
Creatinine, Ser: 0.72 mg/dL (ref 0.44–1.00)
GFR calc non Af Amer: >60 mL/min (ref >60)
Glucose, Bld: 142 mg/dL — ABNORMAL HIGH (ref 65–99)
POTASSIUM: 3.1 mmol/L — AB (ref 3.5–5.1)
Sodium: 139 mmol/L (ref 135–145)

## 2017-10-17 MED ORDER — AMLODIPINE BESYLATE 2.5 MG PO TABS: 2.5000 mg | ORAL_TABLET | Freq: Every day | ORAL | 1 refills | Status: DC

## 2017-10-17 NOTE — Progress Notes (Signed)
BP 138/90 (BP Location: Left Arm, Patient Position: Sitting, Cuff Size: Large)   Pulse 94   Temp 97.9 F (36.6 C) (Other (Comment))   Ht 5\' 1"  (1.549 m)   Wt 247 lb (112 kg)   SpO2 98%   BMI 46.67 kg/m    Subjective:    Patient ID: Dominique Torres, female    DOB: 10-Dec-1980, 36 y.o.   MRN: [id number]  HPI: Dominique Torres is a 36 y.o. female presenting on 10/17/2017 for Hypertension   HPI   Pt feeling well  Relevant past medical, surgical, family and social history reviewed and updated as indicated. Interim medical history since our last visit reviewed. Allergies and medications reviewed and updated.  CURRENT MEDS: Atorvastatin K-dur 10 meq qd  Review of Systems  Constitutional: Positive for appetite change and fatigue. Negative for chills, diaphoresis, fever and unexpected weight change.  HENT: Negative for congestion, dental problem, drooling, ear pain, facial swelling, hearing loss, mouth sores, sneezing, sore throat, trouble swallowing and voice change.   Eyes: Negative for pain, discharge, redness, itching and visual disturbance.  Respiratory: Positive for shortness of breath. Negative for cough, choking and wheezing.   Cardiovascular: Negative for chest pain, palpitations and leg swelling.  Gastrointestinal: Positive for constipation. Negative for abdominal pain, blood in stool, diarrhea and vomiting.  Endocrine: Positive for cold intolerance and heat intolerance. Negative for polydipsia.  Genitourinary: Negative for decreased urine volume, dysuria and hematuria.  Musculoskeletal: Positive for arthralgias, back pain and gait problem.  Skin: Negative for rash.  Allergic/Immunologic: Positive for environmental allergies.  Neurological: Positive for headaches. Negative for seizures, syncope and light-headedness.  Hematological: Negative for adenopathy.  Psychiatric/Behavioral: Positive for agitation and dysphoric mood. Negative for suicidal ideas. The patient is  nervous/anxious.     Per HPI unless specifically indicated above     Objective:    BP 138/90 (BP Location: Left Arm, Patient Position: Sitting, Cuff Size: Large)   Pulse 94   Temp 97.9 F (36.6 C) (Other (Comment))   Ht 5\' 1"  (1.549 m)   Wt 247 lb (112 kg)   SpO2 98%   BMI 46.67 kg/m   Wt Readings from Last 3 Encounters:  10/17/17 247 lb (112 kg)  09/14/17 249 lb (112.9 kg)  09/06/17 249 lb 8 oz (113.2 kg)    Physical Exam  Constitutional: She is oriented to person, place, and time. She appears well-developed and well-nourished.  HENT:  Head: Normocephalic and atraumatic.  Neck: Neck supple.  Cardiovascular: Normal rate and regular rhythm.  Pulmonary/Chest: Effort normal and breath sounds normal.  Abdominal: Soft. Bowel sounds are normal. She exhibits no mass. There is no hepatosplenomegaly. There is no tenderness.  Musculoskeletal: She exhibits no edema.  Lymphadenopathy:    She has no cervical adenopathy.  Neurological: She is alert and oriented to person, place, and time.  Skin: Skin is warm and dry.  Psychiatric: She has a normal mood and affect. Her behavior is normal.  Vitals reviewed.   Results for orders placed or performed during the hospital encounter of 09/24/17  Potassium  Result Value Ref Range   Potassium 3.2 (L) 3.5 - 5.1 mmol/L      Assessment & Plan:    Encounter Diagnoses  Name Primary?  . Hypokalemia Yes  . Essential hypertension   . Hyperlipidemia, unspecified hyperlipidemia type   . Morbid obesity ([place])      -add low dose amlodipine for bp -check bmp today -continue k-dur and lipitor -follow up 1  month to recheck bp.  RTO sooner prn

## 2017-10-28 ENCOUNTER — Encounter (HOSPITAL_COMMUNITY): Payer: Self-pay

## 2017-10-28 ENCOUNTER — Emergency Department (HOSPITAL_COMMUNITY)
Admission: EM | Admit: 2017-10-28 | Discharge: 2017-10-28 | Disposition: A | Discharge status: Home / Self Care | Payer: Self-pay | Attending: Emergency Medicine | Admitting: Emergency Medicine

## 2017-10-28 ENCOUNTER — Other Ambulatory Visit: Payer: Self-pay

## 2017-10-28 DIAGNOSIS — I10 Essential (primary) hypertension: Secondary | ICD-10-CM | POA: Insufficient documentation

## 2017-10-28 DIAGNOSIS — E876 Hypokalemia: Secondary | ICD-10-CM | POA: Insufficient documentation

## 2017-10-28 DIAGNOSIS — Z79899 Other long term (current) drug therapy: Secondary | ICD-10-CM | POA: Insufficient documentation

## 2017-10-28 DIAGNOSIS — R1011 Right upper quadrant pain: Secondary | ICD-10-CM | POA: Insufficient documentation

## 2017-10-28 LAB — COMPREHENSIVE METABOLIC PANEL
ALK PHOS: 85 U/L (ref 38–126)
ALT: 12 U/L — AB (ref 14–54)
ANION GAP: 12 (ref 5–15)
AST: 16 U/L (ref 15–41)
Albumin: 4 g/dL (ref 3.5–5.0)
BILIRUBIN TOTAL: 0.6 mg/dL (ref 0.3–1.2)
BUN: 12 mg/dL (ref 6–20)
CALCIUM: 9.5 mg/dL (ref 8.9–10.3)
CO2: 27 mmol/L (ref 22–32)
CREATININE: 0.71 mg/dL (ref 0.44–1.00)
Chloride: 99 mmol/L — ABNORMAL LOW (ref 101–111)
GFR calc non Af Amer: >60 mL/min (ref >60)
Glucose, Bld: 102 mg/dL — ABNORMAL HIGH (ref 65–99)
Potassium: 3.1 mmol/L — ABNORMAL LOW (ref 3.5–5.1)
Sodium: 138 mmol/L (ref 135–145)
TOTAL PROTEIN: 9.2 g/dL — AB (ref 6.5–8.1)

## 2017-10-28 LAB — CBC
HCT: 41.4 % (ref 36.0–46.0)
HEMOGLOBIN: 14.2 g/dL (ref 12.0–15.0)
MCH: 30.2 pg (ref 26.0–34.0)
MCHC: 34.3 g/dL (ref 30.0–36.0)
MCV: 88.1 fL (ref 78.0–100.0)
PLATELETS: 348 10*3/uL (ref 150–400)
RBC: 4.7 MIL/uL (ref 3.87–5.11)
RDW: 12.7 % (ref 11.5–15.5)
WBC: 9.3 10*3/uL (ref 4.0–10.5)

## 2017-10-28 LAB — URINALYSIS, ROUTINE W REFLEX MICROSCOPIC
Bilirubin Urine: NEGATIVE
GLUCOSE, UA: NEGATIVE mg/dL
KETONES UR: NEGATIVE mg/dL
Leukocytes, UA: NEGATIVE
NITRITE: NEGATIVE
PH: 7 (ref 5.0–8.0)
Protein, ur: 100 mg/dL — AB
SPECIFIC GRAVITY, URINE: 1.018 (ref 1.005–1.030)

## 2017-10-28 LAB — PREGNANCY, URINE: Preg Test, Ur: NEGATIVE

## 2017-10-28 LAB — LIPASE, BLOOD: Lipase: 20 U/L (ref 11–51)

## 2017-10-28 MED ORDER — MORPHINE SULFATE (PF) 4 MG/ML IV SOLN
4.0000 mg | Freq: Once | INTRAVENOUS | Status: AC
  Administered 2017-10-28: 4 mg via INTRAVENOUS
  Filled 2017-10-28: qty 1

## 2017-10-28 MED ORDER — ONDANSETRON HCL 4 MG PO TABS: 4.0000 mg | ORAL_TABLET | Freq: Four times a day (QID) | ORAL | 0 refills | Status: DC

## 2017-10-28 MED ORDER — OXYCODONE-ACETAMINOPHEN 5-325 MG PO TABS: 1.0000 | ORAL_TABLET | ORAL | 0 refills | Status: DC | PRN

## 2017-10-28 MED ORDER — ONDANSETRON HCL 4 MG/2ML IJ SOLN
4.0000 mg | Freq: Once | INTRAMUSCULAR | Status: AC
  Administered 2017-10-28: 4 mg via INTRAVENOUS
  Filled 2017-10-28: qty 2

## 2017-10-28 MED ORDER — SODIUM CHLORIDE 0.9 % IV BOLUS (SEPSIS)
1000.0000 mL | Freq: Once | INTRAVENOUS | Status: AC
  Administered 2017-10-28: 1000 mL via INTRAVENOUS

## 2017-10-28 NOTE — ED Notes (Signed)
Pt walked to NS to ask for [facility] for DC

## 2017-10-28 NOTE — ED Notes (Signed)
Pt reports RUQ pain that has increased in the last week Pain goes into her back  She reports worse with movement   Pt has hx of fibromyalgia, [facility] and has applied for [facility]

## 2017-10-28 NOTE — Discharge Instructions (Signed)
Call [phone] tomorrow morning to get a defined time for your ultrasound of the gallbladder.  Prescription for pain and nausea medication.  Avoid rich greasy foods.  Your potassium was slightly low.  Recently, he had a small amount of blood in your urine.  Follow-up with your primary care doctor.

## 2017-10-28 NOTE — ED Provider Notes (Signed)
[facility] EMERGENCY DEPARTMENT Provider Note   CSN: [id number] Arrival date & time: 10/28/17  1043     History   Chief Complaint Chief Complaint  Patient presents with  . Abdominal Pain    HPI Dominique Torres is a 36 y.o. female.  Patient initially complained of bilateral upper back pain for 1 week.  Now pain has settled in the right upper quadrant with radiation to the right posterior back area.  Pain is worse with movement and deep breaths.  Mother and aunt have had gallbladder issues.  Patient is not presently working.  She is not pregnant.  No fever, sweats, chills, jaundice, nausea, vomiting, diarrhea.  Severity of symptoms is mild to moderate.      Past Medical History:  Diagnosis Date  . Anxiety   . Fibromyalgia   . GERD (gastroesophageal reflux disease)   . Hypertension     Patient Active Problem List   Diagnosis Date Noted  . Hypokalemia 10/17/2017  . Essential hypertension 10/17/2017    Past Surgical History:  Procedure Laterality Date  . exploratory obgyn surgery      OB History    No data available       Home Medications    Prior to Admission medications   Medication Sig Start Date End Date Taking? Authorizing Provider  amLODipine (NORVASC) 2.5 MG tablet Take 1 tablet (2.5 mg total) by mouth daily. 10/17/17  Yes Soyla Dryer, PA-C  atorvastatin (LIPITOR) 10 MG tablet Take 1 tablet (10 mg total) daily by mouth. 09/06/17  Yes Soyla Dryer, PA-C  methocarbamol (ROBAXIN) 500 MG tablet Take 500 mg by mouth daily as needed (fibromyagia pain).   Yes [provider]  potassium chloride (K-DUR) 10 MEQ tablet Take as directed Patient taking differently: 10 mEq 2 (two) times daily. Take as directed 09/24/17  Yes McEl[place], Larene [place], PA-C  ondansetron (ZOFRAN) 4 MG tablet Take 1 tablet (4 mg total) by mouth every 6 (six) hours. 10/28/17   Nat Christen, MD  oxyCODONE-acetaminophen (PERCOCET) 5-325 MG tablet Take 1-2 tablets by mouth every 4  (four) hours as needed. 10/28/17   Nat Christen, MD    Family History Family History  Problem Relation Age of Onset  . Diabetes Father   . Cancer Father        Hx colon cancer  . Hypertension Maternal Aunt   . Hyperlipidemia Maternal Aunt   . Diabetes Paternal Uncle   . Diabetes Paternal Grandfather     Social History Social History   Tobacco Use  . Smoking status: Never Smoker  . Smokeless tobacco: Never Used  Substance Use Topics  . Alcohol use: No  . Drug use: No     Allergies   Banana; Biaxin [clarithromycin]; Ciprofloxacin; Cymbalta [duloxetine hcl]; Erythromycin; Gluten meal; Lyrica [pregabalin]; Other; Sulfa antibiotics; and Tramadol   Review of Systems Review of Systems  All other systems reviewed and are negative.    Physical Exam Updated Vital Signs BP 137/79   Pulse 70   Temp 98.9 F (37.2 C) (Oral)   Resp 15   Ht 5\' 1"  (1.549 m)   Wt 112 kg (247 lb)   LMP 07/03/2017   SpO2 99%   BMI 46.67 kg/m   Physical Exam  Constitutional: She is oriented to person, place, and time. She appears well-developed and well-nourished.  HENT:  Head: Normocephalic and atraumatic.  Eyes: Conjunctivae are normal.  Neck: Neck supple.  Cardiovascular: Normal rate and regular rhythm.  Pulmonary/Chest: Effort normal and  breath sounds normal.  Abdominal: Soft. Bowel sounds are normal.  Minimal right upper quadrant tenderness  Musculoskeletal: Normal range of motion.  Neurological: She is alert and oriented to person, place, and time.  Skin: Skin is warm and dry.  Psychiatric: She has a normal mood and affect. Her behavior is normal.  Nursing note and vitals reviewed.    ED Treatments / Results  Labs (all labs ordered are listed, but only abnormal results are displayed) Labs Reviewed  COMPREHENSIVE METABOLIC PANEL - Abnormal; Notable for the following components:      Result Value   Potassium 3.1 (*)    Chloride 99 (*)    Glucose, Bld 102 (*)    Total  Protein 9.2 (*)    ALT 12 (*)    All other components within normal limits  URINALYSIS, ROUTINE W REFLEX MICROSCOPIC - Abnormal; Notable for the following components:   APPearance HAZY (*)    Hgb urine dipstick SMALL (*)    Protein, ur 100 (*)    Bacteria, UA MANY (*)    Squamous Epithelial / LPF 0-5 (*)    All other components within normal limits  LIPASE, BLOOD  CBC  PREGNANCY, URINE    EKG  EKG Interpretation None       Radiology No results found.  Procedures Procedures (including critical care time)  Medications Ordered in ED Medications  sodium chloride 0.9 % bolus 1,000 mL (1,000 mLs Intravenous New Bag/Given 10/28/17 1341)  ondansetron (ZOFRAN) injection 4 mg (4 mg Intravenous Given 10/28/17 1341)  morphine 4 MG/ML injection 4 mg (4 mg Intravenous Given 10/28/17 1341)     Initial Impression / Assessment and Plan / ED Course  I have reviewed the triage vital signs and the nursing notes.  Pertinent labs & imaging results that were available during my care of the patient were reviewed by me and considered in my medical decision making (see chart for details).     History and physical suspicious for gallbladder issues.  White count and liver functions normal.  Potassium slightly low at 3.1.  Hemoglobin noted in the urine.  These test results were discussed with the patient and her mother.  Will obtain outpatient gallbladder ultrasound.  Discharge medications Percocet and Zofran 4 mg.  Patient will continue to take her potassium.  Final Clinical Impressions(s) / ED Diagnoses   Final diagnoses:  RUQ pain  Hypokalemia    ED Discharge Orders        Ordered    oxyCODONE-acetaminophen (PERCOCET) 5-325 MG tablet  Every 4 hours PRN     10/28/17 1449    ondansetron (ZOFRAN) 4 MG tablet  Every 6 hours     10/28/17 1449    US Abdomen Limited RUQ/Gall Gladder     10/28/17 1449       Nat Christen, MD 10/28/17 1456

## 2017-10-28 NOTE — ED Triage Notes (Signed)
RUQ pain that radiates into right upper back x1 week with nausea. Pain increased with movement. Denies vomiting or diarrhea.

## 2017-10-28 NOTE — ED Notes (Signed)
Awaiting DC instructions

## 2017-11-02 ENCOUNTER — Other Ambulatory Visit: Payer: Self-pay | Admitting: Women's Health

## 2017-11-02 ENCOUNTER — Ambulatory Visit (HOSPITAL_COMMUNITY)
Admission: RE | Admit: 2017-11-02 | Discharge: 2017-11-02 | Disposition: A | Discharge status: Home / Self Care | Payer: Self-pay | Source: Ambulatory Visit | Attending: Emergency Medicine | Admitting: Emergency Medicine

## 2017-11-02 DIAGNOSIS — N200 Calculus of kidney: Secondary | ICD-10-CM | POA: Insufficient documentation

## 2017-11-02 DIAGNOSIS — R1011 Right upper quadrant pain: Secondary | ICD-10-CM | POA: Insufficient documentation

## 2017-11-02 DIAGNOSIS — K869 Disease of pancreas, unspecified: Secondary | ICD-10-CM | POA: Insufficient documentation

## 2017-11-02 NOTE — ED Provider Notes (Signed)
  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  Patient states she is unable to get the MRI now because her anxiety level is too high.  Informed he could do some sedation if needed but states she will need to do it as an outpatient.  I will inform her primary care doctor about the findings and they should be able to arrange follow-up.       Davonna Belling, MD 11/02/17 1005

## 2017-11-05 ENCOUNTER — Other Ambulatory Visit: Payer: Self-pay | Admitting: Physician Assistant

## 2017-11-05 DIAGNOSIS — R935 Abnormal findings on diagnostic imaging of other abdominal regions, including retroperitoneum: Secondary | ICD-10-CM

## 2017-11-05 DIAGNOSIS — K8689 Other specified diseases of pancreas: Secondary | ICD-10-CM

## 2017-11-05 DIAGNOSIS — R933 Abnormal findings on diagnostic imaging of other parts of digestive tract: Secondary | ICD-10-CM

## 2017-11-06 ENCOUNTER — Other Ambulatory Visit: Payer: Self-pay | Admitting: Physician Assistant

## 2017-11-06 DIAGNOSIS — R933 Abnormal findings on diagnostic imaging of other parts of digestive tract: Secondary | ICD-10-CM

## 2017-11-07 ENCOUNTER — Telehealth: Payer: Self-pay | Admitting: Student

## 2017-11-07 NOTE — Telephone Encounter (Signed)
Called patient on 11-05-17 and spoke with patient in regards to getting MRI scheduled to f/u on abnormal [place]. Pt states she would like appointment on one day this week (week of Nov 05, 2017) if available. LPN called [facility] and was given appointment on Thursday, Nov 08, 2017 at 1PM. Pt was then notified of appointment. Pt then requested an open MRI (which are done in [facility] imaging not [facility]), [facility] gave LPN [place] phone number to contact. LPN called [facility] and was told they need to speak to patient in order to schedule appointment.   LPN called and spoke to patient on 11-07-17 and gave her [place] phone number [phone] to call to get open MRI scheduled. Pt verbalized understanding.

## 2017-11-10 ENCOUNTER — Ambulatory Visit (HOSPITAL_COMMUNITY): Payer: Self-pay

## 2017-11-14 ENCOUNTER — Ambulatory Visit: Payer: Self-pay | Admitting: Physician Assistant

## 2017-11-19 ENCOUNTER — Telehealth: Payer: Self-pay | Admitting: Student

## 2017-11-19 NOTE — Telephone Encounter (Signed)
Called pt and left voicemail asking patient to call back regarding getting MRI scheduled and r/s f/u appointment she cancelled with [facility]

## 2017-11-20 ENCOUNTER — Telehealth: Payer: Self-pay | Admitting: Student

## 2017-11-20 NOTE — Telephone Encounter (Signed)
Called pt and left voicemail asking patient to call back regarding getting MRI scheduled. On voicemail pt was informed that if she needed [facility] imaging number to schedule open MRI LPN can provide that number for her again [facility] must speak to patient to schedule). Pt was also informed (via voicemail) that LPN can schedule her MRI appointment, but it would not be an open MRI, if pt wishes for LPN to schedule MRI she was asked to call to notify LPN.   Pt was also asked to call back in order to get rescheduled with [facility]. Pt cancelled 11-14-17 appointment.  Will send letter out to patient's mailing address on file regarding this.

## 2017-11-21 ENCOUNTER — Telehealth: Payer: Self-pay | Admitting: Student

## 2017-11-21 NOTE — Telephone Encounter (Signed)
LPN called and spoke to pt on 11-21-17 to make pt aware of the importance of not putting off getting MRI done since this may be time pressing if treatment is needed. LPN was trying to explain to pt that MRI is to evaluate pancreatic mass found on [place] to determine whether benign or not, while trying to explain this to the patient she interrupted stating she knows what the MRI is for and that this is "my life, my body, my health, so unless you are going to pay for the MRI" she was not going to get it done because "I have no job and no money to pay for it"  Pt was then asked if she has submitted [employer] application (which could help cover the costs of her testing) pt states she submitted it this week. Pt was then offered to be given [place] customer service number for her to call in case she did not hear from them. Pt states she was told that she would hear from them in 2 weeks. Pt was again offered the number for her to call and pt refused.  Pt was then again encouraged to get MRI scheduled, but pt began yelling stating she is in a lot of pain and has a lot of "shit going on" and "do not need a doctor's office harassing me" about something she "cannot control" that LPN needed to stop calling her and "harassing" her about this or she would hang up the phone. LPN was then trying to explain to her that it is very likely she would get approved for financial assistance since she has no income, but patient hung up the phone in the middle of explaination.

## 2017-11-21 NOTE — Telephone Encounter (Signed)
Pt called and left voicemail on nurse's line regarding voicemails being left to her about getting MRI scheduled and [facility].  Pt states she has been going through a lot with her fibromyalgia with having "intense" pain. Pt states she hasn't been able to do much at all as to why she cancelled her appointment with [facility]. Pt states she will call the office when she is "ready", so there is no need for the office to call her regarding this.  Pt states she is only interested in having an open MRI and not a closed MRI because of her "severe" claustrophobia and anxiety. Pt states when she called to schedule open MRI she was told how much it was and states she cannot afford it. Pt states she will not be able to get MRI done until she finds out if she is approved for the [employer] discount.

## 2017-11-29 ENCOUNTER — Other Ambulatory Visit: Payer: Self-pay | Admitting: Physician Assistant

## 2017-11-29 DIAGNOSIS — E876 Hypokalemia: Secondary | ICD-10-CM

## 2017-12-03 ENCOUNTER — Telehealth: Payer: Self-pay | Admitting: Student

## 2017-12-03 NOTE — Telephone Encounter (Signed)
Pt called back on 11-29-17 at 5:41pm and left a voicemail stating she received the voicemail left by Vincente Liberty, patient coordinator and that she will go to the hospital to recheck her potassium. Pt states she is having an emergency situation with her apartment and needs to make sure she has a place to live and that is her priority at this time. Pt states she has "a lot on my plate" and "a lot to get done" pt states she is aware she still needs to get MRI done. Pt states she cannot say when she will be able to get labs drawn, but is hoping to get it done by the end of this week (week of 12-03-17).

## 2017-12-03 NOTE — Telephone Encounter (Signed)
Vincente Liberty, patient coordinator called patient on 11-29-17 and left voicemail notifying pt that K+ needs to be rechecked. Pt was asked to go to [facility] to get labs drawn on Thurs. 11-29-17 or Friday 11-30-17. Pt was also told if she was unable to go Thursday or Friday she could go on Monday 12-03-17.

## 2017-12-12 ENCOUNTER — Other Ambulatory Visit (HOSPITAL_COMMUNITY)
Admission: RE | Admit: 2017-12-12 | Discharge: 2017-12-12 | Disposition: A | Discharge status: Home / Self Care | Payer: Self-pay | Source: Ambulatory Visit | Attending: Physician Assistant | Admitting: Physician Assistant

## 2017-12-12 DIAGNOSIS — E876 Hypokalemia: Secondary | ICD-10-CM | POA: Insufficient documentation

## 2017-12-12 LAB — POTASSIUM: POTASSIUM: 3 mmol/L — AB (ref 3.5–5.1)

## 2017-12-17 ENCOUNTER — Other Ambulatory Visit: Payer: Self-pay | Admitting: Physician Assistant

## 2017-12-17 DIAGNOSIS — I10 Essential (primary) hypertension: Secondary | ICD-10-CM

## 2017-12-17 DIAGNOSIS — E876 Hypokalemia: Secondary | ICD-10-CM

## 2018-01-03 ENCOUNTER — Other Ambulatory Visit (HOSPITAL_COMMUNITY)
Admission: RE | Admit: 2018-01-03 | Discharge: 2018-01-03 | Disposition: A | Discharge status: Home / Self Care | Payer: Self-pay | Source: Ambulatory Visit | Attending: Physician Assistant | Admitting: Physician Assistant

## 2018-01-03 DIAGNOSIS — E876 Hypokalemia: Secondary | ICD-10-CM | POA: Insufficient documentation

## 2018-01-03 DIAGNOSIS — I10 Essential (primary) hypertension: Secondary | ICD-10-CM | POA: Insufficient documentation

## 2018-01-03 LAB — POTASSIUM: Potassium: 3 mmol/L — ABNORMAL LOW (ref 3.5–5.1)

## 2018-01-13 ENCOUNTER — Ambulatory Visit
Admission: RE | Admit: 2018-01-13 | Discharge: 2018-01-13 | Disposition: A | Discharge status: Home / Self Care | Payer: No Typology Code available for payment source | Source: Ambulatory Visit | Attending: Physician Assistant | Admitting: Physician Assistant

## 2018-01-13 DIAGNOSIS — R933 Abnormal findings on diagnostic imaging of other parts of digestive tract: Secondary | ICD-10-CM

## 2018-01-13 MED ORDER — GADOBENATE DIMEGLUMINE 529 MG/ML IV SOLN
20.0000 mL | Freq: Once | INTRAVENOUS | Status: AC | PRN
  Administered 2018-01-13: 20 mL via INTRAVENOUS

## 2018-01-15 ENCOUNTER — Encounter: Payer: Self-pay | Admitting: Physician Assistant

## 2018-01-15 ENCOUNTER — Ambulatory Visit: Payer: Self-pay | Admitting: Physician Assistant

## 2018-01-15 VITALS — BP 148/93 | HR 97 | Temp 97.2°F | Ht 61.0 in | Wt 229.0 lb

## 2018-01-15 DIAGNOSIS — K8689 Other specified diseases of pancreas: Secondary | ICD-10-CM

## 2018-01-15 DIAGNOSIS — N200 Calculus of kidney: Secondary | ICD-10-CM

## 2018-01-15 DIAGNOSIS — G8929 Other chronic pain: Secondary | ICD-10-CM

## 2018-01-15 DIAGNOSIS — E876 Hypokalemia: Secondary | ICD-10-CM

## 2018-01-15 DIAGNOSIS — R3915 Urgency of urination: Secondary | ICD-10-CM

## 2018-01-15 DIAGNOSIS — I10 Essential (primary) hypertension: Secondary | ICD-10-CM

## 2018-01-15 DIAGNOSIS — E785 Hyperlipidemia, unspecified: Secondary | ICD-10-CM

## 2018-01-15 LAB — POCT URINALYSIS DIPSTICK
BILIRUBIN UA: NEGATIVE
Glucose, UA: NEGATIVE
Ketones, UA: NEGATIVE
Leukocytes, UA: NEGATIVE
Nitrite, UA: NEGATIVE
PH UA: 8.5 — AB (ref 5.0–8.0)
Protein, UA: 30
Spec Grav, UA: <=1.005 — AB (ref 1.010–1.025)
UROBILINOGEN UA: 0.2 U/dL

## 2018-01-15 MED ORDER — POTASSIUM CHLORIDE ER 10 MEQ PO TBCR: 10.0000 meq | EXTENDED_RELEASE_TABLET | Freq: Two times a day (BID) | ORAL | 0 refills | Status: DC

## 2018-01-15 NOTE — Progress Notes (Signed)
BP (!) 148/93 (BP Location: Right Wrist, Patient Position: Sitting, Cuff Size: Normal)   Pulse 97   Temp (!) 97.2 F (36.2 C) (Other (Comment))   Ht _0  (1.549 m)   Wt 229 lb (103.9 kg)   LMP 01/01/2018 (Exact Date)   SpO2 100%   BMI 43.27 kg/m    Subjective:    Patient ID: Dominique Torres, female    DOB: [birth year], 37 y.o.   MRN: [id number]  HPI: Dominique Torres is a 37 y.o. female presenting on 01/15/2018 for Hypertension   HPI   Pt is just returning for OV today.  She has been unwilling to come in sooner- states she has a lot of things going on in her life.  She was due for recheck in January of BP.  Also, she was seen in ER on 10/28/17 and found to have pancreatic mass on [place] and was offered MRI at that time to further evaluate.  Pt declined.  MRI was ordered but pt would only go this past weekend for the MRI.   Pt says she has a lot of complaints today- she has been keeping a list:  She complains of Increased saliva  she complains to Constipation alternatiing with diarrhea  Pt complains of increased urine and urgency.  And feels like bladder isn't empty.  States goes to the bathroom all night- usually 4 times each night.  Pt had kidney stone on US done in ER on 10/28/18  Pt says she had reaction to the Gd with MRI.   Pt states fibromyalgia pain increased recently.  Also is having some burning epigastric burning  Pt states her housing situation for her is better now  She has the [facility] discount.    Relevant past medical, surgical, family and social history reviewed and updated as indicated. Interim medical history since our last visit reviewed. Allergies and medications reviewed and updated.   Current Outpatient Medications:  .  potassium chloride (K-DUR) 10 MEQ tablet, Take as directed (Patient taking differently: 10 mEq 2 (two) times daily. Take as directed), Disp: 90 tablet, Rfl: 0 .  amLODipine (NORVASC) 2.5 MG tablet, Take 1 tablet (2.5 mg total) by mouth daily.  (Patient not taking: Reported on 01/15/2018), Disp: 30 tablet, Rfl: 1 .  atorvastatin (LIPITOR) 10 MG tablet, Take 1 tablet (10 mg total) daily by mouth. (Patient not taking: Reported on 01/15/2018), Disp: 90 tablet, Rfl: 1 .  methocarbamol (ROBAXIN) 500 MG tablet, Take 500 mg by mouth daily as needed (fibromyagia pain)., Disp: , Rfl:  .  ondansetron (ZOFRAN) 4 MG tablet, Take 1 tablet (4 mg total) by mouth every 6 (six) hours. (Patient not taking: Reported on 01/15/2018), Disp: 12 tablet, Rfl: 0 .  oxyCODONE-acetaminophen (PERCOCET) 5-325 MG tablet, Take 1-2 tablets by mouth every 4 (four) hours as needed. (Patient not taking: Reported on 01/15/2018), Disp: 20 tablet, Rfl: 0   Review of Systems  Constitutional: Positive for fatigue. Negative for appetite change, chills, diaphoresis, fever and unexpected weight change.  HENT: Negative for congestion, drooling, ear pain, facial swelling, hearing loss, mouth sores, sneezing, sore throat, trouble swallowing and voice change.   Eyes: Negative for pain, discharge, redness, itching and visual disturbance.  Respiratory: Negative for cough, choking, shortness of breath and wheezing.   Cardiovascular: Negative for chest pain, palpitations and leg swelling.  Gastrointestinal: Positive for constipation and diarrhea. Negative for abdominal pain, blood in stool and vomiting.  Endocrine: Positive for cold intolerance and heat intolerance. Negative for  polydipsia.  Genitourinary: Negative for decreased urine volume, dysuria and hematuria.  Musculoskeletal: Positive for arthralgias, back pain and gait problem.  Skin: Negative for rash.  Allergic/Immunologic: Negative for environmental allergies.  Neurological: Positive for light-headedness. Negative for seizures, syncope and headaches.  Hematological: Negative for adenopathy.  Psychiatric/Behavioral: Positive for agitation and dysphoric mood. Negative for suicidal ideas. The patient is nervous/anxious.     Per  HPI unless specifically indicated above     Objective:    BP (!) 148/93 (BP Location: Right Wrist, Patient Position: Sitting, Cuff Size: Normal)   Pulse 97   Temp (!) 97.2 F (36.2 C) (Other (Comment))   Ht _0  (1.549 m)   Wt 229 lb (103.9 kg)   LMP 01/01/2018 (Exact Date)   SpO2 100%   BMI 43.27 kg/m   Wt Readings from Last 3 Encounters:  01/15/18 229 lb (103.9 kg)  10/28/17 247 lb (112 kg)  10/17/17 247 lb (112 kg)    Physical Exam  Constitutional: She is oriented to person, place, and time. She appears well-developed and well-nourished.  HENT:  Head: Normocephalic and atraumatic.  Neck: Neck supple.  Cardiovascular: Normal rate and regular rhythm.  Pulmonary/Chest: Effort normal and breath sounds normal.  Abdominal: Soft. Bowel sounds are normal. She exhibits no mass. There is no hepatosplenomegaly. There is no tenderness.  Musculoskeletal: She exhibits no edema.  Lymphadenopathy:    She has no cervical adenopathy.  Neurological: She is alert and oriented to person, place, and time.  Skin: Skin is warm and dry.  Psychiatric: She has a normal mood and affect. Her behavior is normal.  Vitals reviewed.       Assessment & Plan:    Encounter Diagnoses  Name Primary?  . Mass of pancreas Yes  . Hypokalemia   . Essential hypertension   . Urinary urgency   . Calculus of kidney   . Hyperlipidemia, unspecified hyperlipidemia type   . Morbid obesity ([place])   . Other chronic pain     -Check K+ today.  Will not recheck lipids as pt has not yet started the lipitor -discussed with pt to start amlodipine for her blood pressure -reviewed results of MRI with pt.  Discussed that pancreatic mass was consistent with cancer.  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  Encouraged to drink plenty of water -pt to follow up here 1 month.  RTO sooner prn  (The duration of this appointment visit was 25 minutes of face-to-face time with the patient.  Greater than 50% of this time was spent in counseling, explanation of diagnosis, planning of further management, and coordination of care.)

## 2018-01-16 ENCOUNTER — Inpatient Hospital Stay (HOSPITAL_COMMUNITY): Payer: Medicaid Other | Attending: Hematology | Admitting: Hematology

## 2018-01-16 ENCOUNTER — Other Ambulatory Visit: Payer: Self-pay

## 2018-01-16 ENCOUNTER — Encounter (HOSPITAL_COMMUNITY): Payer: Self-pay | Admitting: Hematology

## 2018-01-16 VITALS — BP 151/92 | HR 83 | Temp 98.1°F | Resp 20 | Wt 232.9 lb

## 2018-01-16 DIAGNOSIS — Z8371 Family history of colonic polyps: Secondary | ICD-10-CM | POA: Insufficient documentation

## 2018-01-16 DIAGNOSIS — M797 Fibromyalgia: Secondary | ICD-10-CM | POA: Diagnosis not present

## 2018-01-16 DIAGNOSIS — Z800 Family history of malignant neoplasm of digestive organs: Secondary | ICD-10-CM | POA: Diagnosis not present

## 2018-01-16 DIAGNOSIS — M791 Myalgia, unspecified site: Secondary | ICD-10-CM

## 2018-01-16 DIAGNOSIS — F439 Reaction to severe stress, unspecified: Secondary | ICD-10-CM

## 2018-01-16 DIAGNOSIS — K8689 Other specified diseases of pancreas: Secondary | ICD-10-CM

## 2018-01-16 DIAGNOSIS — K869 Disease of pancreas, unspecified: Secondary | ICD-10-CM | POA: Insufficient documentation

## 2018-01-16 LAB — PREGNANCY, URINE: Preg Test, Ur: NEGATIVE

## 2018-01-16 NOTE — Progress Notes (Signed)
[facility] NOTE  Patient Care Team: Soyla Dryer, Hershal Coria as PCP - General (Physician Assistant)  CHIEF COMPLAINTS/PURPOSE OF CONSULTATION:  Further workup of pancreatic mass.  HISTORY OF PRESENTING ILLNESS:  Dominique Torres 37 y.o. female is seen in consultation today for newly diagnosed pancreatic body/tail mass.  She went to the emergency room on 10/28/2017 with right upper quadrant pain.  She was scheduled to have an ultrasound, which was done on 11/02/2017 as an outpatient, showing a 3.6 x 4.1 x 4.2 cm pancreatic tail mass.  She was then ordered to have MRI of the abdomen as an outpatient.  She reports epigastric burning pain since MRI was done.  The pain is up to 8/10.  Today however the pain has slightly subsided and is on and off.  No radiation, no aggravating or relieving factors.  She changed her diet since ER visit and lost about 20 pounds.  She denies any nausea or vomiting.  Her baseline bowel movements or diarrhea alternating with constipation which is stable.  She is on disability due to her fibromyalgia.  She used to work office job prior to that.   MEDICAL HISTORY:  Past Medical History:  Diagnosis Date  . Anxiety   . Fibromyalgia   . GERD (gastroesophageal reflux disease)   . Hypercholesterolemia   . Hypertension     SURGICAL HISTORY: Past Surgical History:  Procedure Laterality Date  . exploratory obgyn surgery      SOCIAL HISTORY: Social History   Socioeconomic History  . Marital status: Single    Spouse name: Not on file  . Number of children: Not on file  . Years of education: Not on file  . Highest education level: Not on file  Social Needs  . Financial resource strain: Not on file  . Food insecurity - worry: Not on file  . Food insecurity - inability: Not on file  . Transportation needs - medical: Not on file  . Transportation needs - non-medical: Not on file  Occupational History  . Not on file  Tobacco Use  . Smoking  status: Never Smoker  . Smokeless tobacco: Never Used  Substance and Sexual Activity  . Alcohol use: No  . Drug use: No  . Sexual activity: No    Birth control/protection: None  Other Topics Concern  . Not on file  Social History Narrative  . Not on file    FAMILY HISTORY: Family History  Problem Relation Age of Onset  . Hypertension Mother   . Hypercholesterolemia Mother   . Colon polyps Mother   . Diabetes Father   . Colon cancer Father   . Hypertension Maternal Aunt   . Hyperlipidemia Maternal Aunt   . Stroke Maternal Aunt 54  . Diabetes Paternal Uncle   . Diabetes Paternal Grandfather   . Heart disease Maternal Grandmother        died at age 97  . Stroke Maternal Grandfather        died in his 76s  . Stroke Maternal Aunt 54       paralyzed from stroke    ALLERGIES:  is allergic to gadolinium derivatives; wellbutrin [bupropion]; banana; biaxin [clarithromycin]; ciprofloxacin; cymbalta [duloxetine hcl]; erythromycin; gluten meal; lyrica [pregabalin]; other; sulfa antibiotics; and tramadol.  MEDICATIONS:  Current Outpatient Medications  Medication Sig Dispense Refill  . potassium chloride (K-DUR) 10 MEQ tablet Take 1 tablet (10 mEq total) by mouth 2 (two) times daily. Take as directed 180 tablet 0  . amLODipine (  NORVASC) 2.5 MG tablet Take 1 tablet (2.5 mg total) by mouth daily. (Patient not taking: Reported on 01/15/2018) 30 tablet 1  . atorvastatin (LIPITOR) 10 MG tablet Take 1 tablet (10 mg total) daily by mouth. (Patient not taking: Reported on 01/15/2018) 90 tablet 1   No current facility-administered medications for this visit.     REVIEW OF SYSTEMS:   Constitutional: Denies fevers, chills or abnormal night sweats Eyes: Denies blurriness of vision, double vision or watery eyes Ears, nose, mouth, throat, and face: Denies mucositis or sore throat Respiratory: Denies cough, dyspnea or wheezes Cardiovascular: Denies palpitation, chest discomfort or lower extremity  swelling Gastrointestinal:  Denies nausea, heartburn or change in bowel habits epigastric pain is improving. Skin: Denies abnormal skin rashes Lymphatics: Denies new lymphadenopathy or easy bruising Neurological:Denies numbness, tingling or new weaknesses Behavioral/Psych: Mood is stable, no new changes  All other systems were reviewed with the patient and are negative.  PHYSICAL EXAMINATION: ECOG PERFORMANCE STATUS: 1 - Symptomatic but completely ambulatory  Vitals:   01/16/18 1520  BP: (!) 151/92  Pulse: 83  Resp: 20  Temp: 98.1 F (36.7 C)  SpO2: 100%   Filed Weights   01/16/18 1520  Weight: 232 lb 14.4 oz (105.6 kg)    GENERAL:alert, no distress and comfortable SKIN: skin color, texture, turgor are normal, no rashes or significant lesions EYES: normal, conjunctiva are pink and non-injected, sclera clear OROPHARYNX:no exudate, no erythema and lips, buccal mucosa, and tongue normal  NECK: supple, thyroid normal size, non-tender, without nodularity LYMPH:  no palpable lymphadenopathy in the cervical, axillary or inguinal LUNGS: clear to auscultation and percussion with normal breathing effort HEART: regular rate & rhythm and no murmurs and no lower extremity edema ABDOMEN:abdomen soft, non-tender and normal bowel sounds Musculoskeletal:no cyanosis of digits and no clubbing  PSYCH: alert & oriented x 3 with fluent speech NEURO: no focal motor/sensory deficits  LABORATORY DATA:  I have reviewed the data as listed Lab Results  Component Value Date   WBC 9.3 10/28/2017   HGB 14.2 10/28/2017   HCT 41.4 10/28/2017   MCV 88.1 10/28/2017   PLT 348 10/28/2017     Chemistry      Component Value Date/Time   NA 138 10/28/2017 1224   K 3.0 (L) 01/03/2018 1141   CL 99 (L) 10/28/2017 1224   CO2 27 10/28/2017 1224   BUN 12 10/28/2017 1224   CREATININE 0.71 10/28/2017 1224      Component Value Date/Time   CALCIUM 9.5 10/28/2017 1224   ALKPHOS 85 10/28/2017 1224   AST 16  10/28/2017 1224   ALT 12 (L) 10/28/2017 1224   BILITOT 0.6 10/28/2017 1224       RADIOGRAPHIC STUDIES: I have personally reviewed the radiological images as listed and agreed with the findings in the report. Mr Abdomen W Wo Contrast  Result Date: 01/14/2018 CLINICAL DATA:  Stabbing right upper quadrant abdominal pain for 3 months. Upper abdominal mass on ultrasound. EXAM: MRI ABDOMEN WITHOUT AND WITH CONTRAST TECHNIQUE: Multiplanar multisequence MR imaging of the abdomen was performed both before and after the administration of intravenous contrast. CONTRAST:  55mL MULTIHANCE GADOBENATE DIMEGLUMINE 529 MG/ML IV SOLN COMPARISON:  Ultrasound 11/02/2017. FINDINGS: Lower chest: Small hiatal hernia. The visualized lower chest otherwise appears unremarkable. Hepatobiliary: The liver is normal in signal and demonstrates no focal lesion or abnormal enhancement. No evidence of gallstones, gallbladder wall thickening or biliary dilatation. Pancreas: There is a well-circumscribed mass proximally within the pancreatic tail which  corresponds with the ultrasound finding. This mass measures 3.1 x 4.3 x 3.4 cm and demonstrates T2 hyperintensity, restricted diffusion and moderate slightly heterogeneous enhancement following contrast. The pancreatic duct is noted along the superior margin of this mass and is not dilated. There are no other pancreatic masses. Spleen: There is a splenic mass which measures 4.2 x 4.0 x 4.7 cm. This demonstrates low T2 signal and heterogeneous enhancement following contrast. The spleen is normal in size without other focal abnormality. Adrenals/Urinary Tract: Both adrenal glands appear normal. There are tiny cortical left renal cysts. The right kidney appears normal. No hydronephrosis. Stomach/Bowel: No evidence of bowel wall thickening, distention or surrounding inflammatory change. Vascular/Lymphatic: There are no enlarged abdominal lymph nodes. Small lymph nodes in the porta hepatis are not  enlarged. No significant vascular findings. Other: No ascites or peritoneal nodularity. The anterior abdominal wall appears normal. Musculoskeletal: No acute or significant osseous findings. IMPRESSION: 1. 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 For size, this demonstrates no significant necrosis or mass effect on the pancreatic duct, features which would be expected with adenocarcinoma. Consider atypical neoplasm such as solid pseudopapillary tumor or pancreatic endocrine neoplasm. Tissue sampling recommended. 2. Atypical splenic lesion demonstrating low T2 signal and enhancement, etiology uncertain. Consider atypical hamartoma or sclerosing angiomatoid nodular transformation. This lesion is favored to be incidental, and unlikely to reflect metastatic disease. 3. No typical metastatic disease identified within the liver or peripancreatic lymph nodes. Electronically Signed   By: Richardean Sale M.D.   On: 01/14/2018 08:53    ASSESSMENT & PLAN:   1. Pancreatic mass at the junction of body and tail: I have discussed the findings on the MRI of the abdomen with the patient and her mother in detail.  She has a 3.4 x 4.3 with 3.4 cm mass with no ductal dilatation.  She also has a splenic mass measuring 3.9 x 4.1 cm, suggestive of a benign tumor.  Her liver function panel including total bilirubin was within normal limits.  I have recommended doing a PET/CT scan which will help in staging of her cancer as well as the optimal site to biopsy it.  After the PET/CT scan, we will decide which side to biopsy.  We will also send a CA 19-9 testing today.  We will do urine pregnancy test prior to PET/CT scan.  If pancreatic cancer is confirmed, we will also do germline testing.  Questions were encouraged and answered to her satisfaction.  I have spent 30 minutes with the patient, discussing the findings on the MRI and further plan of action.   Orders Placed This Encounter   Procedures  . NM PET Image Initial (PI) Skull Base To Thigh    Order Specific Question:   If indicated for the ordered procedure, I authorize the administration of a radiopharmaceutical per Radiology protocol    Answer:   Yes    Order Specific Question:   Is the patient pregnant?    Answer:   No    Order Specific Question:   Preferred imaging location?    Answer:   [facility]    Order Specific Question:   Radiology Contrast Protocol - do NOT remove file path    Answer:   [email or web address]    Order Specific Question:   Reason for Exam additional comments    Answer:   Pancreatic mass, splenic mass  . Cancer antigen 19-9    Standing Status:   Future    Standing  Expiration Date:   01/16/2019  . Pregnancy, urine  . Ambulatory referral to Social Work    Referral Priority:   Routine    Referral Type:   Consultation    Referral Reason:   Specialty Services Required    Number of Visits Requested:   1        Derek Jack, MD 01/16/2018 4:14 PM

## 2018-01-17 ENCOUNTER — Encounter: Payer: Self-pay | Admitting: General Practice

## 2018-01-17 LAB — CANCER ANTIGEN 19-9: CA 19-9: 14 U/mL (ref 0–35)

## 2018-01-17 NOTE — Addendum Note (Signed)
Addended by: Derek Jack on: 01/17/2018 10:01 AM   Modules accepted: Level of Service

## 2018-01-17 NOTE — Progress Notes (Signed)
[facility] Psychosocial Distress Screening Clinical Social Work  Clinical Social Work was referred by distress screening protocol.  The patient scored a 10 on the Psychosocial Distress Thermometer which indicates severe distress. Clinical Social Worker Edwyna Shell to assess for distress and other psychosocial needs. CSW and patient discussed common feeling and emotions when being diagnosed with cancer, and the importance of support during treatment. CSW informed patient of the support team and support services at [facility]. CSW provided contact information and encouraged patient to call with any questions or concerns. [place] moved to [place] one year ago to be closer to mother who lives in [place], other family also live in state.  Has friend in [place], however is fairly disconnected from social support in [place] area. Lives alone. "Every day varies w me because I have so much going on w me, with my health and my finances."  "I am stressed about finding out what's there."  Reports that she feels better because "I don't think its the worst case scenario."  Worst case would be "I have cancer."  Patient was just notified that she was approved for disability and Medicaid.  Has been treated for anxiety and depression for "many years."  Has been very stressed due to financial issues - stress much relieved by news that she has benefits.  Gave patient several options for scheduling mental health care - wants to get back on medications and into therapy. Encouraged patient to contact DSS to discuss options for referrals for PCP and mental health care.   Encouraged patient to call CSW back as needed for more resources.    ONCBCN DISTRESS SCREENING 01/16/2018  Screening Type Initial Screening  Distress experienced in past week (1-10) 10  Family Problem type Other (comment)  Emotional problem type Depression;Nervousness/Anxiety;Adjusting to illness;Isolation/feeling alone  Physical Problem type  Pain;Constipation/diarrhea;Changes in urination;Tingling hands/feet;Skin dry/itchy  Physician notified of physical symptoms Yes  Referral to clinical psychology No  Referral to clinical social work Yes  Referral to dietition No  Referral to financial advocate No  Referral to support programs No  Referral to palliative care No    Clinical Social Worker follow up needed: No.  If yes, follow up plan:   Per patient, "I don't even know that I have cancer." Please reconsult CSW if needs change regarding cancer resources.  Edwyna Shell, LCSW Clinical Social Worker Phone:  [phone]

## 2018-01-21 ENCOUNTER — Other Ambulatory Visit (HOSPITAL_COMMUNITY)
Admission: RE | Admit: 2018-01-21 | Discharge: 2018-01-21 | Disposition: A | Discharge status: Home / Self Care | Payer: Medicaid Other | Source: Ambulatory Visit | Attending: Physician Assistant | Admitting: Physician Assistant

## 2018-01-21 DIAGNOSIS — E876 Hypokalemia: Secondary | ICD-10-CM

## 2018-01-21 DIAGNOSIS — I10 Essential (primary) hypertension: Secondary | ICD-10-CM | POA: Insufficient documentation

## 2018-01-21 LAB — POTASSIUM: POTASSIUM: 3.3 mmol/L — AB (ref 3.5–5.1)

## 2018-01-28 ENCOUNTER — Encounter (HOSPITAL_COMMUNITY): Payer: Self-pay | Admitting: *Deleted

## 2018-01-28 ENCOUNTER — Ambulatory Visit (HOSPITAL_COMMUNITY)
Admission: RE | Admit: 2018-01-28 | Discharge: 2018-01-28 | Disposition: A | Discharge status: Home / Self Care | Payer: No Typology Code available for payment source | Source: Ambulatory Visit | Attending: Hematology | Admitting: Hematology

## 2018-01-28 ENCOUNTER — Other Ambulatory Visit (HOSPITAL_COMMUNITY): Payer: Self-pay | Admitting: *Deleted

## 2018-01-28 ENCOUNTER — Telehealth (HOSPITAL_COMMUNITY): Payer: Self-pay | Admitting: Psychiatry

## 2018-01-28 DIAGNOSIS — K869 Disease of pancreas, unspecified: Secondary | ICD-10-CM | POA: Insufficient documentation

## 2018-01-28 DIAGNOSIS — D739 Disease of spleen, unspecified: Secondary | ICD-10-CM | POA: Insufficient documentation

## 2018-01-28 MED ORDER — LORAZEPAM 1 MG PO TABS: ORAL_TABLET | ORAL | 0 refills | Status: DC

## 2018-01-28 MED ORDER — FLUDEOXYGLUCOSE F - 18 (FDG) INJECTION
10.0000 | Freq: Once | INTRAVENOUS | Status: AC
  Administered 2018-01-28: 9.55 via INTRAVENOUS

## 2018-01-28 NOTE — Progress Notes (Signed)
Patient called stating that she was extremely claustrophobic and very anxious about her PET scan scheduled for tonight.  She is asking for something to help calm her nerves prior to the test.  I spoke with Dr. Delton Coombes and orders received for Ativan 1 mg take 1 tablet 90 minutes prior to test and can take a second tablet 30 minutes prior to test if still anxious.    Patient has been notified of instructions and prescription has been sent in to [facility] pharmacy per patient's request.

## 2018-01-28 NOTE — Telephone Encounter (Signed)
D:  Pt called inquiring about IOP vs. Individual Counseling.  A:  Oriented pt about MH-IOP.  According to pt, she is mostly interested in individual counseling at this time.  "I have a lot going on at this time and I would feel more comfortable talking one on one instead of a group.  Provided pt with outpt clinic phone numbers at [facility].  R:  Pt receptive.    Dellia Nims, M.Ed,CNA

## 2018-01-29 ENCOUNTER — Ambulatory Visit (HOSPITAL_COMMUNITY): Payer: No Typology Code available for payment source | Admitting: Hematology

## 2018-01-30 ENCOUNTER — Other Ambulatory Visit: Payer: Self-pay

## 2018-01-30 ENCOUNTER — Inpatient Hospital Stay (HOSPITAL_COMMUNITY): Payer: No Typology Code available for payment source | Attending: Hematology | Admitting: Hematology

## 2018-01-30 ENCOUNTER — Encounter (HOSPITAL_COMMUNITY): Payer: Self-pay | Admitting: Hematology

## 2018-01-30 VITALS — BP 160/88 | HR 94 | Temp 98.4°F | Resp 16 | Wt 232.0 lb

## 2018-01-30 DIAGNOSIS — K8689 Other specified diseases of pancreas: Secondary | ICD-10-CM

## 2018-01-30 DIAGNOSIS — K869 Disease of pancreas, unspecified: Secondary | ICD-10-CM | POA: Insufficient documentation

## 2018-01-30 NOTE — Progress Notes (Signed)
Dominique Torres, Dominique Torres [id number]   CLINIC:  Medical Oncology/Hematology  PCP:  Soyla Dryer, PA-C [place] [address] [phone]   REASON FOR VISIT:  Follow-up for pancreatic mass  CURRENT THERAPY: None   INTERVAL HISTORY:  Dominique Torres 37 y.o. female returns for follow-up of PET scan and blood work results.  She denies any abdominal pains.  She denies any nausea vomiting or diarrhea.  She had some weight loss which was intentional.  She made a lot of changes in her diet after she was found to have pancreatic mass.  She denies any fevers, night sweats or hospitalizations.  REVIEW OF SYSTEMS:  Review of Systems  Constitutional: Negative.   HENT:  Negative.   Respiratory: Negative.   Cardiovascular: Negative.   Gastrointestinal: Negative.   Genitourinary: Negative.    Musculoskeletal: Negative.   Skin: Negative.   Neurological: Negative.   Psychiatric/Behavioral: Negative.      PAST MEDICAL/SURGICAL HISTORY:  Past Medical History:  Diagnosis Date  . Anxiety   . Fibromyalgia   . GERD (gastroesophageal reflux disease)   . Hypercholesterolemia   . Hypertension    Past Surgical History:  Procedure Laterality Date  . exploratory obgyn surgery       SOCIAL HISTORY:  Social History   Socioeconomic History  . Marital status: Single    Spouse name: Not on file  . Number of children: Not on file  . Years of education: Not on file  . Highest education level: Not on file  Occupational History  . Not on file  Social Needs  . Financial resource strain: Not on file  . Food insecurity:    Worry: Not on file    Inability: Not on file  . Transportation needs:    Medical: Not on file    Non-medical: Not on file  Tobacco Use  . Smoking status: Never Smoker  . Smokeless tobacco: Never Used  Substance and Sexual Activity  . Alcohol use: No  . Drug use: No  . Sexual activity: Never    Birth  control/protection: None  Lifestyle  . Physical activity:    Days per week: Not on file    Minutes per session: Not on file  . Stress: Not on file  Relationships  . Social connections:    Talks on phone: Not on file    Gets together: Not on file    Attends religious service: Not on file    Active member of club or organization: Not on file    Attends meetings of clubs or organizations: Not on file    Relationship status: Not on file  . Intimate partner violence:    Fear of current or ex partner: Not on file    Emotionally abused: Not on file    Physically abused: Not on file    Forced sexual activity: Not on file  Other Topics Concern  . Not on file  Social History Narrative  . Not on file    FAMILY HISTORY:  Family History  Problem Relation Age of Onset  . Hypertension Mother   . Hypercholesterolemia Mother   . Colon polyps Mother   . Diabetes Father   . Colon cancer Father   . Hypertension Maternal Aunt   . Hyperlipidemia Maternal Aunt   . Stroke Maternal Aunt 54  . Diabetes Paternal Uncle   . Diabetes Paternal Grandfather   . Heart disease Maternal Grandmother  died at age 103  . Stroke Maternal Grandfather        died in his 75s  . Stroke Maternal Aunt 54       paralyzed from stroke    CURRENT MEDICATIONS:  Outpatient Encounter Medications as of 01/30/2018  Medication Sig  . LORazepam (ATIVAN) 1 MG tablet Take one tablet 90 minutes prior to PET scan.  If still anxious take second tablet 30 minutes prior to PET scan.  Marland Kitchen amLODipine (NORVASC) 2.5 MG tablet Take 1 tablet (2.5 mg total) by mouth daily. (Patient not taking: Reported on 01/15/2018)  . atorvastatin (LIPITOR) 10 MG tablet Take 1 tablet (10 mg total) daily by mouth. (Patient not taking: Reported on 01/15/2018)  . potassium chloride (K-DUR) 10 MEQ tablet Take 1 tablet (10 mEq total) by mouth 2 (two) times daily. Take as directed   No facility-administered encounter medications on file as of 01/30/2018.       ALLERGIES:  Allergies  Allergen Reactions  . Gadolinium Derivatives     Hives,itching, dizziness, burning, and increased fibromyalgia pain  . Wellbutrin [Bupropion]     Severe headaches and dizziness  . Banana Itching    Abdominal pain  . Biaxin [Clarithromycin] Hives and Swelling  . Ciprofloxacin Other (See Comments)    abd pain  . Cymbalta [Duloxetine Hcl] Other (See Comments)    "severe" headaches  . Erythromycin Hives and Swelling  . Gluten Meal Other (See Comments)    Triggers Fibromayalgia  . Lyrica [Pregabalin] Other (See Comments)    Abdominal pain  . Other     Preservatives and MSG  . Sulfa Antibiotics Other (See Comments)    Childhood allergy  . Tramadol Other (See Comments)    Lightheadedness and headaches     PHYSICAL EXAM:  ECOG Performance status: 0  Vitals:   01/30/18 1449 01/30/18 1450  BP: (!) 156/109 (!) 160/88  Pulse: 94   Resp: 16   Temp: 98.4 F (36.9 C)   SpO2: 100%    Filed Weights   01/30/18 1449  Weight: 232 lb (105.2 kg)      LABORATORY DATA:  I have reviewed the labs as listed.  CBC    Component Value Date/Time   WBC 9.3 10/28/2017 1224   RBC 4.70 10/28/2017 1224   HGB 14.2 10/28/2017 1224   HCT 41.4 10/28/2017 1224   PLT 348 10/28/2017 1224   MCV 88.1 10/28/2017 1224   MCH 30.2 10/28/2017 1224   MCHC 34.3 10/28/2017 1224   RDW 12.7 10/28/2017 1224   LYMPHSABS 3.5 07/30/2017 0948   MONOABS 0.4 07/30/2017 0948   EOSABS 0.1 07/30/2017 0948   BASOSABS 0.0 07/30/2017 0948   CMP Latest Ref Rng & Units 01/21/2018 01/03/2018 12/12/2017  Glucose 65 - 99 mg/dL - - -  BUN 6 - 20 mg/dL - - -  Creatinine 0.44 - 1.00 mg/dL - - -  Sodium 135 - 145 mmol/L - - -  Potassium 3.5 - 5.1 mmol/L 3.3(L) 3.0(L) 3.0(L)  Chloride 101 - 111 mmol/L - - -  CO2 22 - 32 mmol/L - - -  Calcium 8.9 - 10.3 mg/dL - - -  Total Protein 6.5 - 8.1 g/dL - - -  Total Bilirubin 0.3 - 1.2 mg/dL - - -  Alkaline Phos 38 - 126 U/L - - -  AST 15 - 41 U/L - -  -  ALT 14 - 54 U/L - - -       DIAGNOSTIC IMAGING:  I have personally reviewed PET CT scan and agree with radiology report.     ASSESSMENT & PLAN:   Pancreatic mass 1.  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  Her CA 19-9 level is normal.  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  No other hypermetabolic opacity in the abdomen or pelvis.  SPNs more than 5 cm are associated with increased risk of high-grade malignancy.  I will make a referral to surgery for resection.  Patient is agreeable to this option.  We will see her back 4-6 weeks after surgery.  We will discuss pathology at that time.  If she is found to have pancreatic adenocarcinoma, we will do germline testing.  She will also require adjuvant chemotherapy.  Questions were encouraged and answered to her satisfaction.  She does not report any pain in the abdomen at this time.  Total time spent is 25 minutes with more than 50% of the time spent face-to-face discussing scan results, likely diagnosis and coordination of care.     Derek Jack, MD [place] [phone]

## 2018-01-30 NOTE — Assessment & Plan Note (Signed)
1.  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  Her CA 19-9 level is normal.  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  No other hypermetabolic opacity in the abdomen or pelvis.  SPNs more than 5 cm are associated with increased risk of high-grade malignancy.  I will make a referral to surgery for resection.  Patient is agreeable to this option.  We will see her back 4-6 weeks after surgery.  We will discuss pathology at that time.  If she is found to have pancreatic adenocarcinoma, we will do germline testing.  She will also require adjuvant chemotherapy.  Questions were encouraged and answered to her satisfaction.  She does not report any pain in the abdomen at this time.

## 2018-02-01 ENCOUNTER — Encounter (HOSPITAL_COMMUNITY): Payer: Self-pay | Admitting: Lab

## 2018-02-01 NOTE — Progress Notes (Unsigned)
Referral sent to [facility] Dr Barry Dienes.  Records faxed on 4/5

## 2018-02-04 ENCOUNTER — Telehealth (HOSPITAL_COMMUNITY): Payer: Self-pay | Admitting: *Deleted

## 2018-02-04 ENCOUNTER — Telehealth: Payer: Self-pay | Admitting: Student

## 2018-02-04 NOTE — Telephone Encounter (Addendum)
LPN called Dr. Maryland Pink office and spoke to nurse to notify him that PA called [facility] to ask for pricings on Hib $52, meningiococcal $175, and pneumonia vaccines $68. PA will write rx for patient to pick up.  LPN called and notified patient that PA will write rx for her immunizations that need to be done before her surgery since she will also be getting her spleen removed. Pt was informed of [facility] prices for immunizations totaling $295. Pt was told she could get the immunizations one at a time if it was a better for her financially. Pt was informed that prescription is available for pick up. Pt verbalized understanding.

## 2018-02-05 ENCOUNTER — Encounter (HOSPITAL_COMMUNITY): Payer: Self-pay | Admitting: Hematology

## 2018-02-05 ENCOUNTER — Inpatient Hospital Stay (HOSPITAL_BASED_OUTPATIENT_CLINIC_OR_DEPARTMENT_OTHER): Payer: No Typology Code available for payment source | Admitting: Hematology

## 2018-02-05 ENCOUNTER — Encounter (HOSPITAL_COMMUNITY): Payer: Self-pay | Admitting: Lab

## 2018-02-05 DIAGNOSIS — K8689 Other specified diseases of pancreas: Secondary | ICD-10-CM

## 2018-02-05 DIAGNOSIS — K869 Disease of pancreas, unspecified: Secondary | ICD-10-CM

## 2018-02-05 NOTE — Progress Notes (Unsigned)
Referral sent to Dr. Bailey Mech at [facility].  They will contact the patient and then schedule appt

## 2018-02-05 NOTE — Assessment & Plan Note (Addendum)
.  1.  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  Her CA 19-9 level is normal.  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  No other hypermetabolic opacity in the abdomen or pelvis.  SPNs more than 5 cm are associated with increased risk of high-grade malignancy.  I will make a referral to surgery for resection. If she is found to have pancreatic adenocarcinoma, will do germline testing.  She was initially referred to Dr. Barry Dienes at [facility] surgery.  Patient reports that she does not have insurance and trying to get on disability.  As per her request, we will make a referral to Dr. Bailey Mech at [facility].  I have recommended pneumococcal vaccine, Hib vaccine, and meningococcal vaccine given at least 2-4 weeks prior to surgery.  I have called her primary doctor and discussed this.  She was referred to health department to get the vaccines.  We will see her back after the surgery.

## 2018-02-05 NOTE — Progress Notes (Signed)
Dominique Torres, Dominique Torres   CLINIC:  Medical Oncology/Hematology  PCP:  Soyla Dryer, PA-C [place] [address] [phone]   REASON FOR VISIT:  Follow-up for pancreatic mass.  INTERVAL HISTORY:  Dominique Torres 37 y.o. female returns for follow-up with some questions about her surgery.  I have referred her to Dr. Barry Dienes at [facility] surgery.  Patient reportedly does not have insurance and has applied for disability which will not kick in until October.  She also had some questions if she needs to have splenectomy.  REVIEW OF SYSTEMS:  Review of Systems  Constitutional: Negative.   HENT:  Negative.   Respiratory: Negative.   Cardiovascular: Negative.   Gastrointestinal: Negative.   Genitourinary: Negative.    Musculoskeletal: Negative.   Skin: Negative.   Neurological: Negative.   Psychiatric/Behavioral: Negative.      PAST MEDICAL/SURGICAL HISTORY:  Past Medical History:  Diagnosis Date  . Anxiety   . Fibromyalgia   . GERD (gastroesophageal reflux disease)   . Hypercholesterolemia   . Hypertension    Past Surgical History:  Procedure Laterality Date  . exploratory obgyn surgery       SOCIAL HISTORY:  Social History   Socioeconomic History  . Marital status: Single    Spouse name: Not on file  . Number of children: Not on file  . Years of education: Not on file  . Highest education level: Not on file  Occupational History  . Not on file  Social Needs  . Financial resource strain: Not on file  . Food insecurity:    Worry: Not on file    Inability: Not on file  . Transportation needs:    Medical: Not on file    Non-medical: Not on file  Tobacco Use  . Smoking status: Never Smoker  . Smokeless tobacco: Never Used  Substance and Sexual Activity  . Alcohol use: No  . Drug use: No  . Sexual activity: Never    Birth control/protection: None  Lifestyle  . Physical activity:    Days  per week: Not on file    Minutes per session: Not on file  . Stress: Not on file  Relationships  . Social connections:    Talks on phone: Not on file    Gets together: Not on file    Attends religious service: Not on file    Active member of club or organization: Not on file    Attends meetings of clubs or organizations: Not on file    Relationship status: Not on file  . Intimate partner violence:    Fear of current or ex partner: Not on file    Emotionally abused: Not on file    Physically abused: Not on file    Forced sexual activity: Not on file  Other Topics Concern  . Not on file  Social History Narrative  . Not on file    FAMILY HISTORY:  Family History  Problem Relation Age of Onset  . Hypertension Mother   . Hypercholesterolemia Mother   . Colon polyps Mother   . Diabetes Father   . Colon cancer Father   . Hypertension Maternal Aunt   . Hyperlipidemia Maternal Aunt   . Stroke Maternal Aunt 54  . Diabetes Paternal Uncle   . Diabetes Paternal Grandfather   . Heart disease Maternal Grandmother        died at age 39  . Stroke Maternal Grandfather  died in his 18s  . Stroke Maternal Aunt 54       paralyzed from stroke    CURRENT MEDICATIONS:  Outpatient Encounter Medications as of 02/05/2018  Medication Sig  . amLODipine (NORVASC) 2.5 MG tablet Take 1 tablet (2.5 mg total) by mouth daily. (Patient not taking: Reported on 01/15/2018)  . atorvastatin (LIPITOR) 10 MG tablet Take 1 tablet (10 mg total) daily by mouth. (Patient not taking: Reported on 01/15/2018)  . LORazepam (ATIVAN) 1 MG tablet Take one tablet 90 minutes prior to PET scan.  If still anxious take second tablet 30 minutes prior to PET scan.  . potassium chloride (K-DUR) 10 MEQ tablet Take 1 tablet (10 mEq total) by mouth 2 (two) times daily. Take as directed   No facility-administered encounter medications on file as of 02/05/2018.     ALLERGIES:  Allergies  Allergen Reactions  . Gadolinium  Derivatives     Hives,itching, dizziness, burning, and increased fibromyalgia pain  . Wellbutrin [Bupropion]     Severe headaches and dizziness  . Banana Itching    Abdominal pain  . Biaxin [Clarithromycin] Hives and Swelling  . Ciprofloxacin Other (See Comments)    abd pain  . Cymbalta [Duloxetine Hcl] Other (See Comments)    "severe" headaches  . Erythromycin Hives and Swelling  . Gluten Meal Other (See Comments)    Triggers Fibromayalgia  . Lyrica [Pregabalin] Other (See Comments)    Abdominal pain  . Other     Preservatives and MSG  . Sulfa Antibiotics Other (See Comments)    Childhood allergy  . Tramadol Other (See Comments)    Lightheadedness and headaches     PHYSICAL EXAM:  ECOG Performance status: 1  There were no vitals filed for this visit. There were no vitals filed for this visit.    LABORATORY DATA:  I have reviewed the labs as listed.  CBC    Component Value Date/Time   WBC 9.3 10/28/2017 1224   RBC 4.70 10/28/2017 1224   HGB 14.2 10/28/2017 1224   HCT 41.4 10/28/2017 1224   PLT 348 10/28/2017 1224   MCV 88.1 10/28/2017 1224   MCH 30.2 10/28/2017 1224   MCHC 34.3 10/28/2017 1224   RDW 12.7 10/28/2017 1224   LYMPHSABS 3.5 07/30/2017 0948   MONOABS 0.4 07/30/2017 0948   EOSABS 0.1 07/30/2017 0948   BASOSABS 0.0 07/30/2017 0948   CMP Latest Ref Rng & Units 01/21/2018 01/03/2018 12/12/2017  Glucose 65 - 99 mg/dL - - -  BUN 6 - 20 mg/dL - - -  Creatinine 0.44 - 1.00 mg/dL - - -  Sodium 135 - 145 mmol/L - - -  Potassium 3.5 - 5.1 mmol/L 3.3(L) 3.0(L) 3.0(L)  Chloride 101 - 111 mmol/L - - -  CO2 22 - 32 mmol/L - - -  Calcium 8.9 - 10.3 mg/dL - - -  Total Protein 6.5 - 8.1 g/dL - - -  Total Bilirubin 0.3 - 1.2 mg/dL - - -  Alkaline Phos 38 - 126 U/L - - -  AST 15 - 41 U/L - - -  ALT 14 - 54 U/L - - -        ASSESSMENT & PLAN:   Pancreatic mass .1.  Pancreatic mass at the proximal part of the tail:  PET CT scan  showed 4.1 x 3.2 cm mass in  the proximal pancreatic tail, max SUV 5.8.  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  Her  CA 19-9 level is normal.  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  No other hypermetabolic opacity in the abdomen or pelvis.  SPNs more than 5 cm are associated with increased risk of high-grade malignancy.  I will make a referral to surgery for resection. If she is found to have pancreatic adenocarcinoma, will do germline testing.  She was initially referred to Dr. Barry Dienes at [facility] surgery.  Patient reports that she does not have insurance and trying to get on disability.  As per her request, we will make a referral to Dr. Bailey Mech at [facility].  I have recommended pneumococcal vaccine, Hib vaccine, and meningococcal vaccine given at least 2-4 weeks prior to surgery.  We will see her back after the surgery.      Orders placed this encounter:  No orders of the defined types were placed in this encounter.     Derek Jack, MD [place] [phone]

## 2018-02-06 NOTE — Telephone Encounter (Signed)
Dominique Torres,  I talked with Dr. Raliegh Ip about this. He stated he "didn't know what to do". According to his note, he recommended she get the immunizations. He also sent her to Dr. Carlis Abbott at [facility] I think for the whipple procedure. He doesn't say anything about her spleen being removed. Long story short, do we want to help her get the immunizations or wait and see if Mina Marble gives them to her.

## 2018-02-13 ENCOUNTER — Ambulatory Visit: Payer: Self-pay | Admitting: Physician Assistant

## 2018-02-18 ENCOUNTER — Ambulatory Visit (HOSPITAL_COMMUNITY): Payer: No Typology Code available for payment source | Admitting: Licensed Clinical Social Worker

## 2018-02-28 ENCOUNTER — Ambulatory Visit: Payer: Medicaid Other | Admitting: Physician Assistant

## 2018-02-28 ENCOUNTER — Encounter: Payer: Self-pay | Admitting: Physician Assistant

## 2018-02-28 VITALS — BP 150/87 | HR 104 | Temp 97.9°F | Ht 61.0 in | Wt 231.0 lb

## 2018-02-28 DIAGNOSIS — E876 Hypokalemia: Secondary | ICD-10-CM

## 2018-02-28 DIAGNOSIS — I10 Essential (primary) hypertension: Secondary | ICD-10-CM

## 2018-02-28 DIAGNOSIS — K8689 Other specified diseases of pancreas: Secondary | ICD-10-CM

## 2018-02-28 DIAGNOSIS — R3915 Urgency of urination: Secondary | ICD-10-CM

## 2018-02-28 LAB — POCT URINALYSIS DIPSTICK
BILIRUBIN UA: NEGATIVE
GLUCOSE UA: NEGATIVE
KETONES UA: NEGATIVE
Leukocytes, UA: NEGATIVE
NITRITE UA: NEGATIVE
PROTEIN UA: 30
Urobilinogen, UA: 0.2 E.U./dL
pH, UA: 8 (ref 5.0–8.0)

## 2018-02-28 NOTE — Progress Notes (Signed)
BP (!) 150/87 (BP Location: Right Arm, Patient Position: Sitting, Cuff Size: Large)   Pulse (!) 104   Temp 97.9 F (36.6 C)   Ht 5\' 1"  (1.549 m)   Wt 231 lb (104.8 kg)   LMP 02/19/2018 (Exact Date)   SpO2 98%   BMI 43.65 kg/m    Subjective:    Patient ID: Dominique Torres, female    DOB: [birth year], 37 y.o.   MRN: [id number]  HPI: Dominique Torres is a 37 y.o. female presenting on 02/28/2018 for Hypertension (recheck)   HPI   Pt states urinary urgency.  She feels like it started when she started taking the K+ pills.  No burning or pain.   She has not taken her amlodipine today.  She has only taken it 2 or 3 times.  She is still very apprehensive of medications because she is allergic to so many.   She has not been seen at [facility] yet. She turned in her financial application there.  She is going there to get her surgery done (for the pancreas mass)  She says she no longer is approved for [facility].  She says because she got approved for disability.  Her regular medicaid doesn't start until October.   Pt saw a counselor one time- the spiritual counselor / chaplain person and is planning to return to see her.    Relevant past medical, surgical, family and social history reviewed and updated as indicated. Interim medical history since our last visit reviewed. Allergies and medications reviewed and updated.   Current Outpatient Medications:  .  amLODipine (NORVASC) 2.5 MG tablet, Take 1 tablet (2.5 mg total) by mouth daily., Disp: 30 tablet, Rfl: 1 .  potassium chloride (K-DUR) 10 MEQ tablet, Take 1 tablet (10 mEq total) by mouth 2 (two) times daily. Take as directed, Disp: 180 tablet, Rfl: 0 .  atorvastatin (LIPITOR) 10 MG tablet, Take 1 tablet (10 mg total) daily by mouth. (Patient not taking: Reported on 01/15/2018), Disp: 90 tablet, Rfl: 1 .  LORazepam (ATIVAN) 1 MG tablet, Take one tablet 90 minutes prior to PET scan.  If still anxious take second tablet 30 minutes prior to  PET scan. (Patient not taking: Reported on 02/28/2018), Disp: 2 tablet, Rfl: 0   Review of Systems  Constitutional: Positive for fatigue. Negative for appetite change, chills, diaphoresis, fever and unexpected weight change.  HENT: Positive for sneezing. Negative for congestion, dental problem, drooling, ear pain, facial swelling, hearing loss, mouth sores, sore throat, trouble swallowing and voice change.   Eyes: Positive for itching. Negative for pain, discharge, redness and visual disturbance.  Respiratory: Positive for cough. Negative for choking, shortness of breath and wheezing.   Cardiovascular: Negative for chest pain, palpitations and leg swelling.  Gastrointestinal: Positive for constipation and diarrhea. Negative for abdominal pain, blood in stool and vomiting.  Endocrine: Positive for cold intolerance and heat intolerance. Negative for polydipsia.  Genitourinary: Negative for decreased urine volume, dysuria and hematuria.  Musculoskeletal: Positive for arthralgias, back pain and gait problem.  Skin: Positive for rash.  Allergic/Immunologic: Positive for environmental allergies.  Neurological: Positive for light-headedness and headaches. Negative for seizures and syncope.  Hematological: Negative for adenopathy.  Psychiatric/Behavioral: Positive for agitation and dysphoric mood. Negative for suicidal ideas. The patient is nervous/anxious.     Per HPI unless specifically indicated above     Objective:    BP (!) 150/87 (BP Location: Right Arm, Patient Position: Sitting, Cuff Size: Large)   Pulse Marland Kitchen)  104   Temp 97.9 F (36.6 C)   Ht 5\' 1"  (1.549 m)   Wt 231 lb (104.8 kg)   LMP 02/19/2018 (Exact Date)   SpO2 98%   BMI 43.65 kg/m   Wt Readings from Last 3 Encounters:  02/28/18 231 lb (104.8 kg)  01/30/18 232 lb (105.2 kg)  01/16/18 232 lb 14.4 oz (105.6 kg)    Physical Exam  Constitutional: She is oriented to person, place, and time. She appears well-developed and  well-nourished.  HENT:  Head: Normocephalic and atraumatic.  Neck: Neck supple.  Cardiovascular: Normal rate and regular rhythm.  Pulmonary/Chest: Effort normal and breath sounds normal.  Abdominal: Soft. Bowel sounds are normal. She exhibits no mass. There is no hepatosplenomegaly. There is no tenderness.  Musculoskeletal: She exhibits no edema.  Lymphadenopathy:    She has no cervical adenopathy.  Neurological: She is alert and oriented to person, place, and time.  Skin: Skin is warm and dry.  Psychiatric: She has a normal mood and affect. Her behavior is normal.  Vitals reviewed.       Assessment & Plan:   Encounter Diagnoses  Name Primary?  . Essential hypertension Yes  . Hypokalemia   . Urinary urgency   . Mass of pancreas     -Check BMP today to check the K+ -UA was okay.  Encouraged to avoid caffeine, sodas, drink plenty of water -Continue with [facility] for surgery -Continue with counseling -Take the amlodipine every day -pt to follow up 1 month to recheck the blood pressure.  RTO sooner prn

## 2018-03-07 ENCOUNTER — Other Ambulatory Visit (HOSPITAL_COMMUNITY)
Admission: RE | Admit: 2018-03-07 | Discharge: 2018-03-07 | Disposition: A | Discharge status: Home / Self Care | Payer: No Typology Code available for payment source | Source: Ambulatory Visit | Attending: Physician Assistant | Admitting: Physician Assistant

## 2018-03-07 ENCOUNTER — Other Ambulatory Visit: Payer: Self-pay | Admitting: Physician Assistant

## 2018-03-07 DIAGNOSIS — I10 Essential (primary) hypertension: Secondary | ICD-10-CM

## 2018-03-07 DIAGNOSIS — E876 Hypokalemia: Secondary | ICD-10-CM

## 2018-03-07 LAB — BASIC METABOLIC PANEL
ANION GAP: 11 (ref 5–15)
BUN: 14 mg/dL (ref 6–20)
CO2: 30 mmol/L (ref 22–32)
Calcium: 9 mg/dL (ref 8.9–10.3)
Chloride: 98 mmol/L — ABNORMAL LOW (ref 101–111)
Creatinine, Ser: 0.74 mg/dL (ref 0.44–1.00)
GFR calc Af Amer: >60 mL/min (ref >60)
GLUCOSE: 100 mg/dL — AB (ref 65–99)
Potassium: 3.1 mmol/L — ABNORMAL LOW (ref 3.5–5.1)
Sodium: 139 mmol/L (ref 135–145)

## 2018-03-19 ENCOUNTER — Other Ambulatory Visit (HOSPITAL_COMMUNITY): Payer: Self-pay

## 2018-03-19 DIAGNOSIS — K8689 Other specified diseases of pancreas: Secondary | ICD-10-CM

## 2018-04-04 ENCOUNTER — Ambulatory Visit: Payer: Medicaid Other | Admitting: Physician Assistant

## 2018-04-04 ENCOUNTER — Encounter: Payer: Self-pay | Admitting: Physician Assistant

## 2018-04-04 ENCOUNTER — Other Ambulatory Visit (HOSPITAL_COMMUNITY)
Admission: RE | Admit: 2018-04-04 | Discharge: 2018-04-04 | Disposition: A | Discharge status: Home / Self Care | Payer: No Typology Code available for payment source | Source: Ambulatory Visit | Attending: Physician Assistant | Admitting: Physician Assistant

## 2018-04-04 VITALS — BP 140/96 | HR 98 | Temp 98.0°F | Ht 61.0 in | Wt 235.0 lb

## 2018-04-04 DIAGNOSIS — R21 Rash and other nonspecific skin eruption: Secondary | ICD-10-CM

## 2018-04-04 DIAGNOSIS — K8689 Other specified diseases of pancreas: Secondary | ICD-10-CM

## 2018-04-04 DIAGNOSIS — E876 Hypokalemia: Secondary | ICD-10-CM | POA: Insufficient documentation

## 2018-04-04 DIAGNOSIS — I10 Essential (primary) hypertension: Secondary | ICD-10-CM | POA: Insufficient documentation

## 2018-04-04 LAB — POTASSIUM: Potassium: 3.1 mmol/L — ABNORMAL LOW (ref 3.5–5.1)

## 2018-04-04 MED ORDER — AMLODIPINE BESYLATE 5 MG PO TABS: 5.0000 mg | ORAL_TABLET | Freq: Every day | ORAL | 1 refills | Status: DC

## 2018-04-04 NOTE — Progress Notes (Signed)
BP (!) 140/96 (BP Location: Right Wrist, Patient Position: Sitting, Cuff Size: Normal)   Pulse 98   Temp 98 F (36.7 C) (Oral)   Ht 5\' 1"  (1.549 m)   Wt 235 lb (106.6 kg)   SpO2 99%   BMI 44.40 kg/m    Subjective:    Patient ID: Dominique Torres, female    DOB: 11-21-1980, 37 y.o.   MRN: [id number]  HPI: Dominique Torres is a 37 y.o. female presenting on 04/04/2018 for Hypertension   HPI   Pt has Surgery scheduled on  6/20 at [facility] to have her pancreas and spleen removed due to pancreas mass.  Pt says she has a lot of stress related to her current and upcoming medical issues  Pt complains of Heat rash on both of her knees.  She took generic zyrtec which helped.    She says it itches when she gets hot.  She is taking her amlodipine and is feeling comforatable taking it.  Pt is very nervous about taking medicines due to her reported multitude of drug allergies.   Last K+ 5/9 was 3.1   Ordered to be done 5/23 but pt didn't get it done  Relevant past medical, surgical, family and social history reviewed and updated as indicated. Interim medical history since our last visit reviewed. Allergies and medications reviewed and updated.    Current Outpatient Medications:  .  amLODipine (NORVASC) 2.5 MG tablet, Take 1 tablet (2.5 mg total) by mouth daily., Disp: 30 tablet, Rfl: 1 .  potassium chloride (K-DUR) 10 MEQ tablet, Take 1 tablet (10 mEq total) by mouth 2 (two) times daily. Take as directed, Disp: 180 tablet, Rfl: 0 .  atorvastatin (LIPITOR) 10 MG tablet, Take 1 tablet (10 mg total) daily by mouth. (Patient not taking: Reported on 01/15/2018), Disp: 90 tablet, Rfl: 1 .  LORazepam (ATIVAN) 1 MG tablet, Take one tablet 90 minutes prior to PET scan.  If still anxious take second tablet 30 minutes prior to PET scan. (Patient not taking: Reported on 02/28/2018), Disp: 2 tablet, Rfl: 0   Review of Systems  Constitutional: Positive for fatigue. Negative for appetite change, chills,  diaphoresis, fever and unexpected weight change.  HENT: Positive for drooling. Negative for congestion, dental problem, ear pain, facial swelling, hearing loss, mouth sores, sneezing, sore throat, trouble swallowing and voice change.   Eyes: Negative for pain, discharge, redness, itching and visual disturbance.  Respiratory: Positive for cough and shortness of breath. Negative for choking and wheezing.   Cardiovascular: Negative for chest pain, palpitations and leg swelling.  Gastrointestinal: Positive for diarrhea. Negative for abdominal pain, blood in stool, constipation and vomiting.  Endocrine: Positive for cold intolerance and heat intolerance. Negative for polydipsia.  Genitourinary: Negative for decreased urine volume, dysuria and hematuria.  Musculoskeletal: Positive for arthralgias, back pain and gait problem.  Skin: Positive for rash.  Allergic/Immunologic: Positive for environmental allergies.  Neurological: Positive for light-headedness and headaches. Negative for seizures and syncope.  Hematological: Negative for adenopathy.  Psychiatric/Behavioral: Positive for agitation and dysphoric mood. Negative for suicidal ideas. The patient is nervous/anxious.     Per HPI unless specifically indicated above     Objective:    BP (!) 140/96 (BP Location: Right Wrist, Patient Position: Sitting, Cuff Size: Normal)   Pulse 98   Temp 98 F (36.7 C) (Oral)   Ht 5\' 1"  (1.549 m)   Wt 235 lb (106.6 kg)   SpO2 99%   BMI 44.40 kg/m  Wt Readings from Last 3 Encounters:  04/04/18 235 lb (106.6 kg)  02/28/18 231 lb (104.8 kg)  01/30/18 232 lb (105.2 kg)    Physical Exam  Constitutional: She is oriented to person, place, and time. She appears well-developed and well-nourished.  HENT:  Head: Normocephalic and atraumatic.  Neck: Neck supple.  Cardiovascular: Normal rate and regular rhythm.  Pulmonary/Chest: Effort normal and breath sounds normal.  Abdominal: Soft. Bowel sounds are  normal. She exhibits no mass. There is no hepatosplenomegaly. There is no tenderness.  Musculoskeletal: She exhibits no edema.  Lymphadenopathy:    She has no cervical adenopathy.  Neurological: She is alert and oriented to person, place, and time.  Skin: Skin is warm and dry. Rash noted.  Fine papular rash on both knees, anteriorly.  No redness or signs of infection  Psychiatric: She has a normal mood and affect. Her behavior is normal.  Vitals reviewed.       Assessment & Plan:   Encounter Diagnoses  Name Primary?  . Essential hypertension Yes  . Hypokalemia   . Rash   . Pancreatic mass     -pt to Increase the amlodipine to 5mg  -pt to continue OTC zyrtec and cortisone cream to knees for rash -Check K+ today -pt to Follow up 1 month to recheck the blood pressure.  She is to RTO sooner prn

## 2018-04-09 ENCOUNTER — Telehealth: Payer: Self-pay | Admitting: Student

## 2018-04-09 NOTE — Telephone Encounter (Signed)
Pt called back and was notified that cough is most likely not due to amlodipine. Pt states she doesn't have any other symptoms other than cough and hot flashes. Pt was recommended to take OTC delsym for cough. Pt verbalized understanding.

## 2018-04-28 IMAGING — PT NM PET TUM IMG INITIAL (PI) SKULL BASE T - THIGH
4 series · 25 of 25 positions shown · non-contrast
Comparison: MRI abdomen dated 01/13/2018

CLINICAL DATA: Initial treatment strategy for pancreatic mass.

EXAM:
NUCLEAR MEDICINE PET SKULL BASE TO THIGH
TECHNIQUE: 9.55 mCi F-18 FDG was injected intravenously. Full-ring PET imaging
was performed from the skull base to thigh after the radiotracer. CT
data was obtained and used for attenuation correction and anatomic
localization.
Fasting blood glucose: 89 mg/dl

[Series 3: ct wb fusion · axial · 5.0mm · 0.98mm/px · z∈[-1494,-706]mm · 8 of 316 slices shown]
[im 1/316]
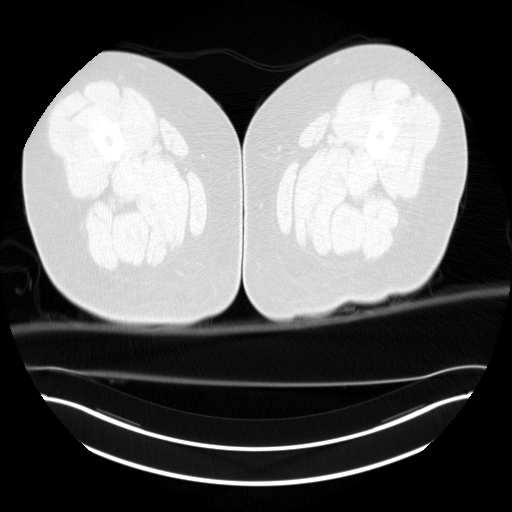
[im 46/316]
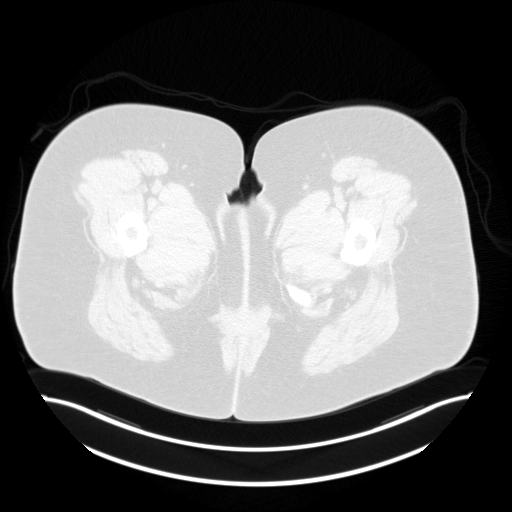
[im 91/316]
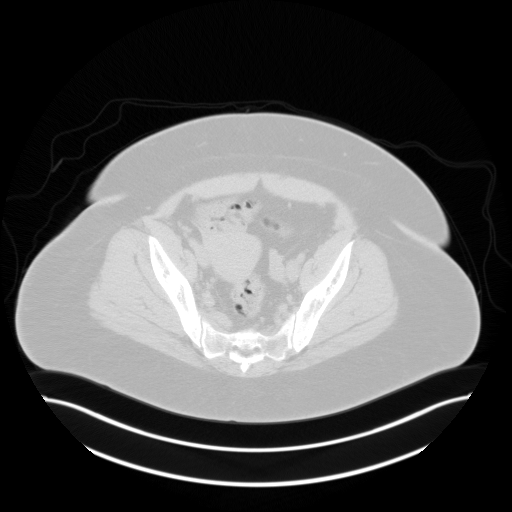
[im 136/316]
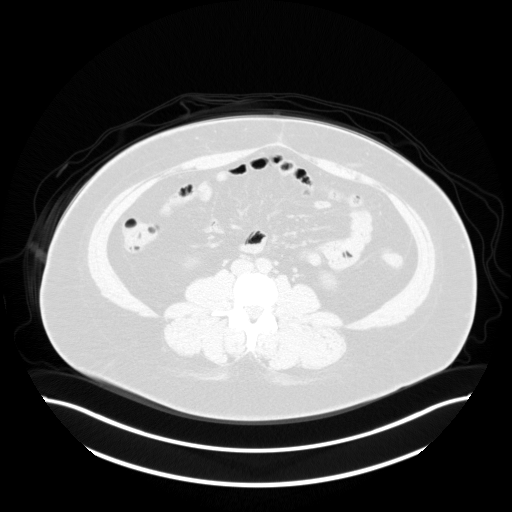
[im 181/316]
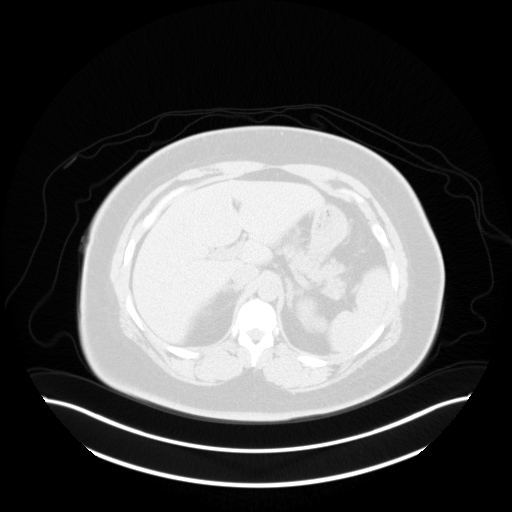
[im 226/316]
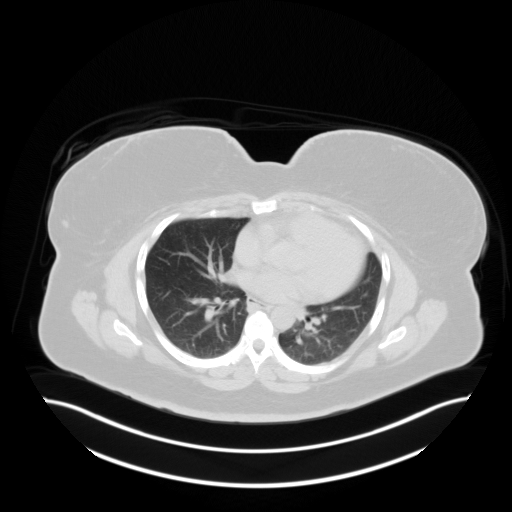
[im 271/316]
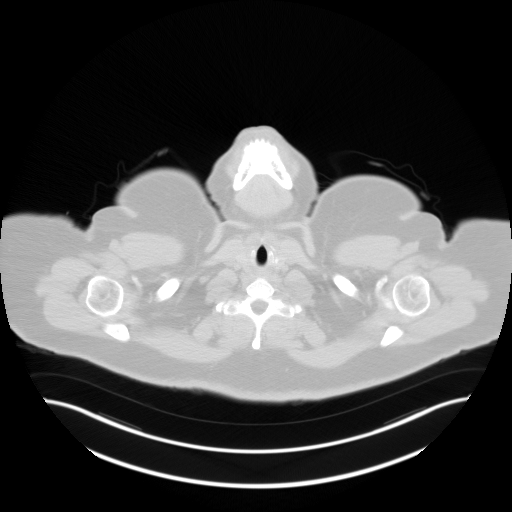
[im 316/316  brain]
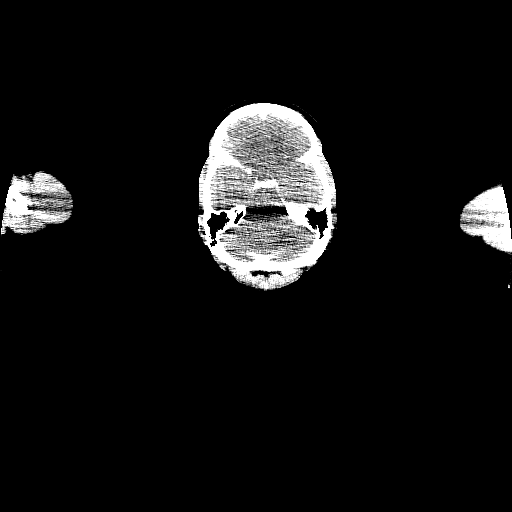

[Series 4: pet wb · axial · 5.0mm · 4.11mm/px · z∈[-1494,-706]mm · 8 of 316 slices shown]
[im 1/316]
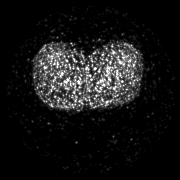
[im 46/316]
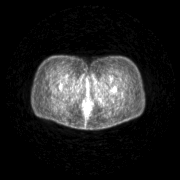
[im 91/316]
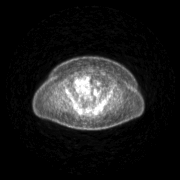
[im 136/316]
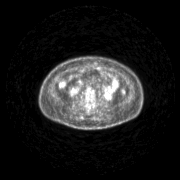
[im 181/316]
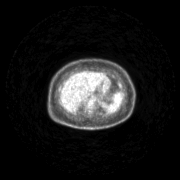
[im 226/316]
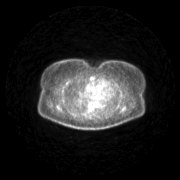
[im 271/316]
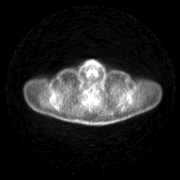
[im 316/316]
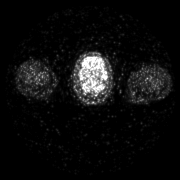

[Series 5: pet wb uncorrected · axial · 5.0mm · 4.11mm/px · z∈[-1494,-706]mm · 8 of 316 slices shown]
[im 1/316]
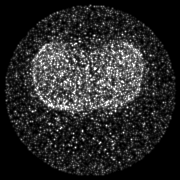
[im 46/316]
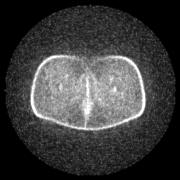
[im 91/316]
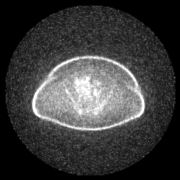
[im 136/316]
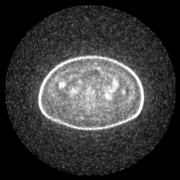
[im 181/316]
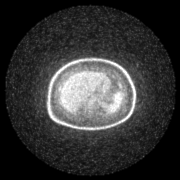
[im 226/316]
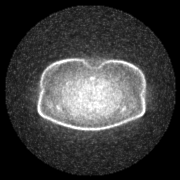
[im 271/316]
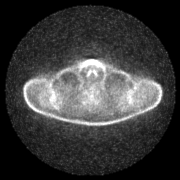
[im 316/316]
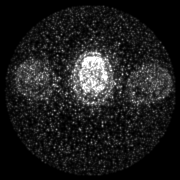

[results mm oncology reading · 1.0mm · 1.00mm/px · 1 of 3 slices shown]
[im 1/3]
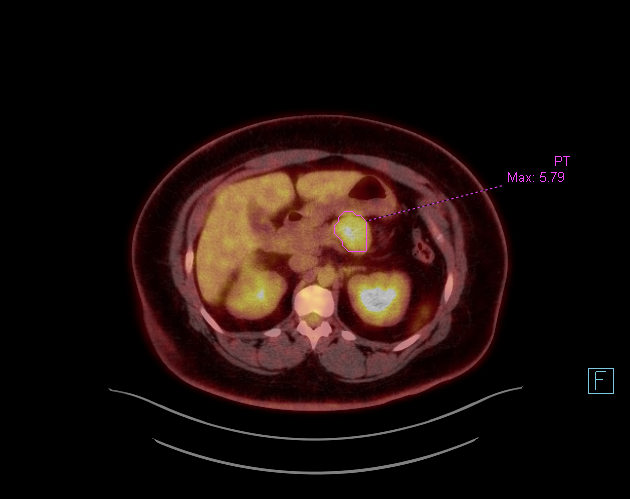

[25 of 25 positions shown; findings below may reference images not displayed]

FINDINGS: Mediastinal blood pool activity: SUV max

NECK: No hypermetabolic cervical lymphadenopathy.

Incidental CT findings: none

CHEST: No hypermetabolic thoracic lymphadenopathy.

No metabolic pulmonary nodules.

Incidental CT findings: none

ABDOMEN/PELVIS: 4.1 x 3.2 cm mass in the proximal pancreatic tail
(series 3/image 145), max SUV 5.8. No associated atrophy of the
distal pancreatic tail. No pancreatic ductal dilatation. Given the
imaging appearance and the patient's age/gender, this continues to
favor pancreatic solid pseudopapillary neoplasm.

4.2 cm rounded mass in the anterior spleen with mild hypermetabolism
relative to background, max SUV 4.6. This is not suggestive of
metastasis and continues to favor a benign hamartoma.

No hypermetabolic lymphadenopathy in the abdomen/pelvis.

Incidental CT findings: 4 mm nonobstructing right lower pole renal
calculus.

SKELETON: No focal hypermetabolic activity to suggest skeletal
metastasis.

Incidental CT findings: none
IMPRESSION: 4.1 x 3.2 cm mass in the proximal pancreatic tail, favoring
pancreatic solid pseudopapillary neoplasm.

4.2 cm mass in the anterior spleen, likely reflecting a benign
hamartoma.

No findings suspicious for metastatic disease.

## 2018-05-06 ENCOUNTER — Encounter: Payer: Self-pay | Admitting: Physician Assistant

## 2018-05-06 ENCOUNTER — Ambulatory Visit: Payer: Medicaid Other | Admitting: Physician Assistant

## 2018-05-06 VITALS — BP 137/90 | HR 79 | Temp 98.1°F | Ht 61.0 in | Wt 230.5 lb

## 2018-05-06 DIAGNOSIS — R7989 Other specified abnormal findings of blood chemistry: Secondary | ICD-10-CM

## 2018-05-06 DIAGNOSIS — E876 Hypokalemia: Secondary | ICD-10-CM

## 2018-05-06 DIAGNOSIS — Z90411 Acquired partial absence of pancreas: Secondary | ICD-10-CM | POA: Insufficient documentation

## 2018-05-06 DIAGNOSIS — Z9889 Other specified postprocedural states: Secondary | ICD-10-CM

## 2018-05-06 DIAGNOSIS — E785 Hyperlipidemia, unspecified: Secondary | ICD-10-CM

## 2018-05-06 DIAGNOSIS — N3941 Urge incontinence: Secondary | ICD-10-CM

## 2018-05-06 DIAGNOSIS — Z9081 Acquired absence of spleen: Secondary | ICD-10-CM | POA: Insufficient documentation

## 2018-05-06 DIAGNOSIS — I10 Essential (primary) hypertension: Secondary | ICD-10-CM

## 2018-05-06 NOTE — Progress Notes (Signed)
BP 137/90 (BP Location: Right Wrist, Patient Position: Sitting, Cuff Size: Normal)   Pulse 79   Temp 98.1 F (36.7 C) (Other (Comment))   Ht 5\' 1"  (1.549 m)   Wt 230 lb 8 oz (104.6 kg)   SpO2 99%   BMI 43.55 kg/m    Subjective:    Patient ID: Dominique Torres, female    DOB: 14-Jan-1981, 37 y.o.   MRN: [id number]  HPI: Dominique Torres is a 37 y.o. female presenting on 05/06/2018 for No chief complaint on file.   HPI   Pt is here today for follow up   Pt has now had surgery at [facility] several weeks ago to for subtotal pancreas removal and spleenectomy.    Platelets 6/23 were 388 Hg 6/21 was 11.5 K+ 6/21 3.5  Pt sore tailbone- her surgery was 6 1/2 hours.  She has some fatigue.  She has f/u Friday at [facility] to get pathology results and have drain removed.    Pt is having regular BMs.    Pt still having urinary urgency   Relevant past medical, surgical, family and social history reviewed and updated as indicated. Interim medical history since our last visit reviewed. Allergies and medications reviewed and updated.   Current Outpatient Medications:  .  oxyCODONE-acetaminophen (PERCOCET/ROXICET) 5-325 MG tablet, Take 1-2 tablets by mouth every 6 (six) hours as needed for severe pain., Disp: , Rfl:  .  potassium chloride (K-DUR) 10 MEQ tablet, Take 1 tablet (10 mEq total) by mouth 2 (two) times daily. Take as directed, Disp: 180 tablet, Rfl: 0 .  amLODipine (NORVASC) 5 MG tablet, Take 1 tablet (5 mg total) by mouth daily. (Patient not taking: Reported on 05/06/2018), Disp: 30 tablet, Rfl: 1  Review of Systems  Constitutional: Positive for fatigue. Negative for appetite change, chills, diaphoresis, fever and unexpected weight change.  HENT: Negative for congestion, dental problem, drooling, ear pain, facial swelling, hearing loss, mouth sores, sneezing, sore throat, trouble swallowing and voice change.   Eyes: Negative for pain, discharge, redness, itching and visual  disturbance.  Respiratory: Negative for cough, choking, shortness of breath and wheezing.   Cardiovascular: Negative for chest pain, palpitations and leg swelling.  Gastrointestinal: Negative for abdominal pain, blood in stool, constipation, diarrhea and vomiting.  Endocrine: Negative for cold intolerance, heat intolerance and polydipsia.  Genitourinary: Negative for decreased urine volume, dysuria and hematuria.  Musculoskeletal: Negative for arthralgias, back pain and gait problem.  Skin: Negative for rash.  Allergic/Immunologic: Negative for environmental allergies.  Neurological: Negative for seizures, syncope, light-headedness and headaches.  Hematological: Negative for adenopathy.  Psychiatric/Behavioral: Negative for agitation, dysphoric mood and suicidal ideas. The patient is not nervous/anxious.     Per HPI unless specifically indicated above     Objective:    BP 137/90 (BP Location: Right Wrist, Patient Position: Sitting, Cuff Size: Normal)   Pulse 79   Temp 98.1 F (36.7 C) (Other (Comment))   Ht 5\' 1"  (1.549 m)   Wt 230 lb 8 oz (104.6 kg)   SpO2 99%   BMI 43.55 kg/m   Wt Readings from Last 3 Encounters:  05/06/18 230 lb 8 oz (104.6 kg)  04/04/18 235 lb (106.6 kg)  02/28/18 231 lb (104.8 kg)    Physical Exam  Constitutional: She is oriented to person, place, and time. She appears well-developed and well-nourished.  HENT:  Head: Normocephalic and atraumatic.  Neck: Neck supple.  Cardiovascular: Normal rate and regular rhythm.  Pulmonary/Chest: Effort normal and breath  sounds normal. No respiratory distress. She has no wheezes. She has no rales.  Abdominal: Soft. Bowel sounds are normal. There is no tenderness.  Surgical wounds appear to be healing.  No redness, purulence or sign infection  Neurological: She is alert and oriented to person, place, and time.  Skin: Skin is warm and dry.  Psychiatric: She has a normal mood and affect. Her behavior is normal.   Nursing note and vitals reviewed.       Assessment & Plan:   Encounter Diagnoses  Name Primary?  . Essential hypertension Yes  . Hypokalemia   . Recent major surgery   . Elevated platelet count   . History of total splenectomy   . History of partial pancreatectomy   . Hyperlipidemia, unspecified hyperlipidemia type   . Morbid obesity ([place])   . Urge incontinence     -will Check a1c early September -pt to Restart amlodipine -discussed with pt will wait on medication for urinary urgency. Counseled on kegel exercises and need for weight loss -Check blood today -discussed lipids with pt and will wait on rx for this per pt's preference -discussed splenectomy state.  Will review latest recommendations and discuss at follow up appointment -pt to follow up with [facility] this Friday as scheduled -pt to follow up here 39month.  RTO sooner for any problems

## 2018-05-08 ENCOUNTER — Other Ambulatory Visit (HOSPITAL_COMMUNITY)
Admission: RE | Admit: 2018-05-08 | Discharge: 2018-05-08 | Disposition: A | Discharge status: Home / Self Care | Payer: No Typology Code available for payment source | Source: Ambulatory Visit | Attending: Physician Assistant | Admitting: Physician Assistant

## 2018-05-08 DIAGNOSIS — R7989 Other specified abnormal findings of blood chemistry: Secondary | ICD-10-CM

## 2018-05-08 DIAGNOSIS — I10 Essential (primary) hypertension: Secondary | ICD-10-CM

## 2018-05-08 DIAGNOSIS — Z9889 Other specified postprocedural states: Secondary | ICD-10-CM

## 2018-05-08 DIAGNOSIS — E876 Hypokalemia: Secondary | ICD-10-CM | POA: Insufficient documentation

## 2018-05-08 LAB — CBC WITH DIFFERENTIAL/PLATELET
BASOS ABS: 0 10*3/uL (ref 0.0–0.1)
Basophils Relative: 0 %
EOS ABS: 0.3 10*3/uL (ref 0.0–0.7)
EOS PCT: 3 %
HEMATOCRIT: 36.9 % (ref 36.0–46.0)
Hemoglobin: 12.4 g/dL (ref 12.0–15.0)
Lymphocytes Relative: 38 %
Lymphs Abs: 3.9 10*3/uL (ref 0.7–4.0)
MCH: 30.2 pg (ref 26.0–34.0)
MCHC: 33.6 g/dL (ref 30.0–36.0)
MCV: 89.8 fL (ref 78.0–100.0)
MONO ABS: 0.7 10*3/uL (ref 0.1–1.0)
MONOS PCT: 7 %
NEUTROS ABS: 5.3 10*3/uL (ref 1.7–7.7)
Neutrophils Relative %: 52 %
PLATELETS: 838 10*3/uL — AB (ref 150–400)
RBC: 4.11 MIL/uL (ref 3.87–5.11)
RDW: 12 % (ref 11.5–15.5)
WBC: 10.2 10*3/uL (ref 4.0–10.5)

## 2018-05-08 LAB — BASIC METABOLIC PANEL
ANION GAP: 10 (ref 5–15)
BUN: 9 mg/dL (ref 6–20)
CO2: 31 mmol/L (ref 22–32)
CREATININE: 0.67 mg/dL (ref 0.44–1.00)
Calcium: 9.4 mg/dL (ref 8.9–10.3)
Chloride: 98 mmol/L (ref 98–111)
Glucose, Bld: 126 mg/dL — ABNORMAL HIGH (ref 70–99)
Potassium: 3.3 mmol/L — ABNORMAL LOW (ref 3.5–5.1)
SODIUM: 139 mmol/L (ref 135–145)

## 2018-05-22 ENCOUNTER — Inpatient Hospital Stay (HOSPITAL_COMMUNITY): Payer: Self-pay | Attending: Hematology

## 2018-05-22 DIAGNOSIS — K8689 Other specified diseases of pancreas: Secondary | ICD-10-CM

## 2018-05-22 DIAGNOSIS — D136 Benign neoplasm of pancreas: Secondary | ICD-10-CM | POA: Insufficient documentation

## 2018-05-22 DIAGNOSIS — K869 Disease of pancreas, unspecified: Secondary | ICD-10-CM | POA: Insufficient documentation

## 2018-05-22 LAB — COMPREHENSIVE METABOLIC PANEL
ALT: 18 U/L (ref 0–44)
AST: 22 U/L (ref 15–41)
Albumin: 4.1 g/dL (ref 3.5–5.0)
Alkaline Phosphatase: 93 U/L (ref 38–126)
Anion gap: 13 (ref 5–15)
BUN: 13 mg/dL (ref 6–20)
CALCIUM: 9.6 mg/dL (ref 8.9–10.3)
CHLORIDE: 97 mmol/L — AB (ref 98–111)
CO2: 28 mmol/L (ref 22–32)
CREATININE: 0.61 mg/dL (ref 0.44–1.00)
GFR calc Af Amer: >60 mL/min (ref >60)
GFR calc non Af Amer: >60 mL/min (ref >60)
GLUCOSE: 116 mg/dL — AB (ref 70–99)
POTASSIUM: 3.1 mmol/L — AB (ref 3.5–5.1)
Sodium: 138 mmol/L (ref 135–145)
TOTAL PROTEIN: 8.8 g/dL — AB (ref 6.5–8.1)
Total Bilirubin: 0.5 mg/dL (ref 0.3–1.2)

## 2018-05-22 LAB — CBC WITH DIFFERENTIAL/PLATELET
BASOS ABS: 0 10*3/uL (ref 0.0–0.1)
Basophils Relative: 0 %
EOS PCT: 1 %
Eosinophils Absolute: 0.2 10*3/uL (ref 0.0–0.7)
HCT: 39.6 % (ref 36.0–46.0)
Hemoglobin: 13.7 g/dL (ref 12.0–15.0)
LYMPHS PCT: 46 %
Lymphs Abs: 5.1 10*3/uL — ABNORMAL HIGH (ref 0.7–4.0)
MCH: 30.9 pg (ref 26.0–34.0)
MCHC: 34.6 g/dL (ref 30.0–36.0)
MCV: 89.2 fL (ref 78.0–100.0)
Monocytes Absolute: 0.6 10*3/uL (ref 0.1–1.0)
Monocytes Relative: 5 %
Neutro Abs: 5.2 10*3/uL (ref 1.7–7.7)
Neutrophils Relative %: 48 %
Platelets: 532 10*3/uL — ABNORMAL HIGH (ref 150–400)
RBC: 4.44 MIL/uL (ref 3.87–5.11)
RDW: 12.9 % (ref 11.5–15.5)
WBC: 11.1 10*3/uL — ABNORMAL HIGH (ref 4.0–10.5)

## 2018-05-23 LAB — CANCER ANTIGEN 19-9: CA 19-9: 16 U/mL (ref 0–35)

## 2018-05-29 ENCOUNTER — Encounter (HOSPITAL_COMMUNITY): Payer: Self-pay | Admitting: Hematology

## 2018-05-29 ENCOUNTER — Other Ambulatory Visit: Payer: Self-pay

## 2018-05-29 ENCOUNTER — Inpatient Hospital Stay (HOSPITAL_BASED_OUTPATIENT_CLINIC_OR_DEPARTMENT_OTHER): Payer: Self-pay | Admitting: Hematology

## 2018-05-29 DIAGNOSIS — K8689 Other specified diseases of pancreas: Secondary | ICD-10-CM

## 2018-05-29 DIAGNOSIS — D136 Benign neoplasm of pancreas: Secondary | ICD-10-CM

## 2018-05-29 NOTE — Assessment & Plan Note (Addendum)
.  1.  Pancreatic mass at the proximal part of the tail:   -PET CT scan  showed 4.1 x 3.2 cm mass in the proximal pancreatic tail, max SUV 5.8.  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  Her CA 19-9 level is normal.  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 - Underwent robotic subtotal pancreatectomy and splenectomy on 04/18/2018 by Dr. Jyl Heinz at [facility]. 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 -She has received vaccinations prior to splenectomy at [facility].  She has some vaccinations scheduled in December of this year at [facility]. - Should she develop any high fevers, she was recommended to go to the emergency room immediately. -As both the tumors in the pancreas and spleen were benign, I did not recommend any follow-up visits with [place].  I will be glad to see her on as-needed basis.

## 2018-05-29 NOTE — Progress Notes (Signed)
Dominique Torres, [place] [id number]   CLINIC:  Medical Oncology/Hematology  PCP:  Soyla Dryer, PA-C [place] [address] [phone]   REASON FOR VISIT:  Follow-up for [place] pancreatic mass  CURRENT THERAPY: surgical removal of mass   INTERVAL HISTORY:  Dominique Torres 37 y.o. female returns for routine follow-up pancreatic mass. Patient is here with her mother. Patient is recovering well after surgery. She is following up with her primary care MD to get all her vaccines needed. Patient states she has a bruised tailbone from her 6 hour surgery. Other than that she has no complaints. Denies any abdominal pain. Denies any bleeding or dark stools. Denies any fevers or infections post surgery. Patient appetite remains great at 100%. Her energy levels are returning to normal but are at 75% now.     REVIEW OF SYSTEMS:  Review of Systems  Constitutional: Positive for fatigue.  HENT:  Negative.   Eyes: Negative.   Respiratory: Negative.   Cardiovascular: Negative.   Gastrointestinal: Negative.   Endocrine: Negative.   Genitourinary: Negative.    Musculoskeletal: Negative.   Skin: Positive for itching.  Neurological: Positive for dizziness and numbness.  Hematological: Bruises/bleeds easily.  Psychiatric/Behavioral: Negative.      PAST MEDICAL/SURGICAL HISTORY:  Past Medical History:  Diagnosis Date  . Anxiety   . Fibromyalgia   . GERD (gastroesophageal reflux disease)   . Hypercholesterolemia   . Hypertension    Past Surgical History:  Procedure Laterality Date  . exploratory obgyn surgery       SOCIAL HISTORY:  Social History   Socioeconomic History  . Marital status: Single    Spouse name: Not on file  . Number of children: Not on file  . Years of education: Not on file  . Highest education level: Not on file  Occupational History  . Not on file  Social Needs  . Financial resource strain: Not on file  .  Food insecurity:    Worry: Not on file    Inability: Not on file  . Transportation needs:    Medical: Not on file    Non-medical: Not on file  Tobacco Use  . Smoking status: Never Smoker  . Smokeless tobacco: Never Used  Substance and Sexual Activity  . Alcohol use: No  . Drug use: No  . Sexual activity: Never    Birth control/protection: None  Lifestyle  . Physical activity:    Days per week: Not on file    Minutes per session: Not on file  . Stress: Not on file  Relationships  . Social connections:    Talks on phone: Not on file    Gets together: Not on file    Attends religious service: Not on file    Active member of club or organization: Not on file    Attends meetings of clubs or organizations: Not on file    Relationship status: Not on file  . Intimate partner violence:    Fear of current or ex partner: Not on file    Emotionally abused: Not on file    Physically abused: Not on file    Forced sexual activity: Not on file  Other Topics Concern  . Not on file  Social History Narrative  . Not on file    FAMILY HISTORY:  Family History  Problem Relation Age of Onset  . Hypertension Mother   . Hypercholesterolemia Mother   . Colon polyps Mother   .  Diabetes Father   . Colon cancer Father   . Hypertension Maternal Aunt   . Hyperlipidemia Maternal Aunt   . Stroke Maternal Aunt 54  . Diabetes Paternal Uncle   . Diabetes Paternal Grandfather   . Heart disease Maternal Grandmother        died at age 69  . Stroke Maternal Grandfather        died in his 24s  . Stroke Maternal Aunt 54       paralyzed from stroke    CURRENT MEDICATIONS:  Outpatient Encounter Medications as of 05/29/2018  Medication Sig  . amLODipine (NORVASC) 5 MG tablet Take 1 tablet (5 mg total) by mouth daily.  Mar[place] Kitchen aspirin 81 MG chewable tablet Chew by mouth.  . polyethylene glycol powder (GLYCOLAX/MIRALAX) powder MIX 1 MEASURED CAPFUL(17 GRAMS) IN 4-8 OUNCES OF WATER OR JUICE. STIR UNTIL  DISSOLVED AND DRINK DAILY FOR 14 DAYS  . potassium chloride (K-DUR) 10 MEQ tablet Take 1 tablet (10 mEq total) by mouth 2 (two) times daily. Take as directed  . [DISCONTINUED] oxyCODONE-acetaminophen (PERCOCET/ROXICET) 5-325 MG tablet Take 1-2 tablets by mouth every 6 (six) hours as needed for severe pain.   No facility-administered encounter medications on file as of 05/29/2018.     ALLERGIES:  Allergies  Allergen Reactions  . Gadolinium Derivatives     Hives,itching, dizziness, burning, and increased fibromyalgia pain  . Wellbutrin [Bupropion]     Severe headaches and dizziness  . Banana Itching    Abdominal pain  . Biaxin [Clarithromycin] Hives and Swelling  . Ciprofloxacin Other (See Comments)    abd pain  . Cymbalta [Duloxetine Hcl] Other (See Comments)    "severe" headaches  . Erythromycin Hives and Swelling  . Gluten Meal Other (See Comments)    Triggers Fibromayalgia  . Lyrica [Pregabalin] Other (See Comments)    Abdominal pain  . Other     Preservatives and MSG  . Sulfa Antibiotics Other (See Comments)    Childhood allergy  . Tramadol Other (See Comments)    Lightheadedness and headaches     PHYSICAL EXAM:  ECOG Performance status: 0  Vitals:   05/29/18 1549  BP: (!) 151/88  Pulse: 78  Resp: 16  Temp: 98.1 F (36.7 C)  SpO2: 100%   Filed Weights   05/29/18 1549  Weight: 226 lb 3.2 oz (102.6 kg)    Physical Exam   LABORATORY DATA:  I have reviewed the labs as listed.  CBC    Component Value Date/Time   WBC 11.1 (H) 05/22/2018 1131   RBC 4.44 05/22/2018 1131   HGB 13.7 05/22/2018 1131   HCT 39.6 05/22/2018 1131   PLT 532 (H) 05/22/2018 1131   MCV 89.2 05/22/2018 1131   MCH 30.9 05/22/2018 1131   MCHC 34.6 05/22/2018 1131   RDW 12.9 05/22/2018 1131   LYMPHSABS 5.1 (H) 05/22/2018 1131   MONOABS 0.6 05/22/2018 1131   EOSABS 0.2 05/22/2018 1131   BASOSABS 0.0 05/22/2018 1131   CMP Latest Ref Rng & Units 05/22/2018 05/08/2018 04/04/2018  Glucose  70 - 99 mg/dL 116(H) 126(H) -  BUN 6 - 20 mg/dL 13 9 -  Creatinine 0.44 - 1.00 mg/dL 0.61 0.67 -  Sodium 135 - 145 mmol/L 138 139 -  Potassium 3.5 - 5.1 mmol/L 3.1(L) 3.3(L) 3.1(L)  Chloride 98 - 111 mmol/L 97(L) 98 -  CO2 22 - 32 mmol/L 28 31 -  Calcium 8.9 - 10.3 mg/dL 9.6 9.4 -  Total Protein 6.5 -  8.1 g/dL 8.8(H) - -  Total Bilirubin 0.3 - 1.2 mg/dL 0.5 - -  Alkaline Phos 38 - 126 U/L 93 - -  AST 15 - 41 U/L 22 - -  ALT 0 - 44 U/L 18 - -        ASSESSMENT & PLAN:   Pancreatic mass .1.  Pancreatic mass at the proximal part of the tail:   -PET CT scan  showed 4.1 x 3.2 cm mass in the proximal pancreatic tail, max SUV 5.8.  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  Her CA 19-9 level is normal.  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 - Underwent robotic subtotal pancreatectomy and splenectomy on 04/18/2018 by Dr. Jyl Heinz at [facility]. 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 -She has received vaccinations prior to splenectomy at [facility].  She has some vaccinations scheduled in December of this year at [facility]. - Should she develop any high fevers, she was recommended to go to the emergency room immediately. -As both the tumors in the pancreas and spleen were benign, I did not recommend any follow-up visits with [place].  I will be glad to see her on as-needed basis.      Orders placed this encounter:  No orders of the defined types were placed in this encounter.     Derek Jack, MD [place] [phone]

## 2018-06-06 ENCOUNTER — Encounter: Payer: Self-pay | Admitting: Physician Assistant

## 2018-06-06 ENCOUNTER — Ambulatory Visit: Payer: Medicaid Other | Admitting: Physician Assistant

## 2018-06-06 VITALS — BP 110/78 | HR 100 | Temp 97.9°F | Ht 61.0 in | Wt 225.5 lb

## 2018-06-06 DIAGNOSIS — I10 Essential (primary) hypertension: Secondary | ICD-10-CM

## 2018-06-06 DIAGNOSIS — R05 Cough: Secondary | ICD-10-CM

## 2018-06-06 DIAGNOSIS — R7989 Other specified abnormal findings of blood chemistry: Secondary | ICD-10-CM

## 2018-06-06 DIAGNOSIS — Z9081 Acquired absence of spleen: Secondary | ICD-10-CM

## 2018-06-06 DIAGNOSIS — H0015 Chalazion left lower eyelid: Secondary | ICD-10-CM

## 2018-06-06 DIAGNOSIS — F329 Major depressive disorder, single episode, unspecified: Secondary | ICD-10-CM

## 2018-06-06 DIAGNOSIS — F32A Depression, unspecified: Secondary | ICD-10-CM

## 2018-06-06 DIAGNOSIS — R059 Cough, unspecified: Secondary | ICD-10-CM

## 2018-06-06 DIAGNOSIS — E876 Hypokalemia: Secondary | ICD-10-CM

## 2018-06-06 MED ORDER — AMOXICILLIN 500 MG PO CAPS: 500.0000 mg | ORAL_CAPSULE | Freq: Three times a day (TID) | ORAL | 0 refills | Status: DC

## 2018-06-06 MED ORDER — CITALOPRAM HYDROBROMIDE 20 MG PO TABS: 20.0000 mg | ORAL_TABLET | Freq: Every day | ORAL | 0 refills | Status: DC

## 2018-06-06 NOTE — Progress Notes (Signed)
BP 110/78 (BP Location: Left Arm, Patient Position: Sitting, Cuff Size: Large)   Pulse 100   Temp 97.9 F (36.6 C)   Ht 5\' 1"  (1.549 m)   Wt 225 lb 8 oz (102.3 kg)   SpO2 97%   BMI 42.61 kg/m    Subjective:    Patient ID: Dominique Torres, female    DOB: 1981-02-19, 37 y.o.   MRN: [id number]  HPI: Dominique Torres is a 37 y.o. female presenting on 06/06/2018 for Follow-up   HPI   Pt upset.  She States she just hasn't come to terms with the changes in her health.  She denies SI, HI.   Pt complains of a Stye x 3 weeks.  She has been wearing glasses for the past month or 2.  Prior to that she wore contact lenses.  She denies changes in vision  She says she is Not sleeping well.  She attributes it to stress and pain (tailbone)  Since the weekend she has had a dry cough and sore throat.  The throat hurts when she coughs.   She complains of her menstrual Period lasting for 10 days.  She says it Usually lasts 3-5 or 7 days.  History irregular menses.    She says her tailbone is still sore.  She says it's been 7 weeks since she had surgery.   She says she called the surgery office at [facility] and discussed with them and she is to call them back if it hurts longer than another 3 weeks.   She is seeing a therapist in [place] and sees the chaplain at the hospital.  She isn't sure she "meshes" with her therapist.   Pt starts medicare in October   Relevant past medical, surgical, family and social history reviewed and updated as indicated. Interim medical history since our last visit reviewed. Allergies and medications reviewed and updated.   Current Outpatient Medications:  .  amLODipine (NORVASC) 5 MG tablet, Take 1 tablet (5 mg total) by mouth daily., Disp: 30 tablet, Rfl: 1 .  aspirin 81 MG chewable tablet, Chew by mouth., Disp: , Rfl:  .  potassium chloride (K-DUR) 10 MEQ tablet, Take 1 tablet (10 mEq total) by mouth 2 (two) times daily. Take as directed, Disp: 180 tablet, Rfl:  0   Review of Systems  Constitutional: Positive for diaphoresis and fatigue. Negative for appetite change, chills, fever and unexpected weight change.  HENT: Positive for drooling (excess saliva), sore throat and voice change (voice goes in and out). Negative for congestion, ear pain, facial swelling, hearing loss, mouth sores, sneezing and trouble swallowing.   Eyes: Negative for pain, discharge, redness, itching and visual disturbance.  Respiratory: Positive for cough. Negative for choking, shortness of breath and wheezing.   Cardiovascular: Negative for chest pain, palpitations and leg swelling.  Gastrointestinal: Negative for abdominal pain, blood in stool, constipation, diarrhea and vomiting.  Endocrine: Positive for cold intolerance and heat intolerance. Negative for polydipsia.  Genitourinary: Negative for decreased urine volume, dysuria and hematuria.  Musculoskeletal: Positive for arthralgias and gait problem (at times). Negative for back pain.  Skin: Negative for rash.  Allergic/Immunologic: Positive for environmental allergies.  Neurological: Positive for light-headedness. Negative for seizures, syncope and headaches.  Hematological: Negative for adenopathy.  Psychiatric/Behavioral: Positive for agitation and dysphoric mood. Negative for suicidal ideas. The patient is nervous/anxious.     Per HPI unless specifically indicated above     Objective:    BP 110/78 (BP Location: Left Arm,  Patient Position: Sitting, Cuff Size: Large)   Pulse 100   Temp 97.9 F (36.6 C)   Ht 5\' 1"  (1.549 m)   Wt 225 lb 8 oz (102.3 kg)   SpO2 97%   BMI 42.61 kg/m   Wt Readings from Last 3 Encounters:  06/06/18 225 lb 8 oz (102.3 kg)  05/29/18 226 lb 3.2 oz (102.6 kg)  05/06/18 230 lb 8 oz (104.6 kg)    Physical Exam  Constitutional: She is oriented to person, place, and time. She appears well-developed and well-nourished.  HENT:  Head: Normocephalic and atraumatic.  Right Ear: Hearing,  tympanic membrane, external ear and ear canal normal.  Left Ear: Hearing, tympanic membrane, external ear and ear canal normal.  Nose: Nose normal.  Mouth/Throat: Uvula is midline and oropharynx is clear and moist. No oropharyngeal exudate.  Neck: Neck supple.  Cardiovascular: Normal rate and regular rhythm.  Pulmonary/Chest: Effort normal and breath sounds normal. She has no wheezes.  Abdominal: Soft. Bowel sounds are normal. She exhibits no mass. There is no hepatosplenomegaly. There is no tenderness.  Surgical sites well healed with no signs infection  Musculoskeletal: She exhibits no edema.       Lumbar back: She exhibits tenderness. She exhibits no swelling.       Back:  Pain and mildly tender lower back just inferior to the start of the gluteal cleft.  There is no breakdown of skin, no redness, no wound, no visible abnormality (nurse Berenice assisted)  Lymphadenopathy:    She has no cervical adenopathy.  Neurological: She is alert and oriented to person, place, and time.  Skin: Skin is warm and dry.  Psychiatric: She has a normal mood and affect. Her speech is normal and behavior is normal. Thought content normal. Her affect is not inappropriate.  Pt is tearful at times.  At other times,  pt  texts on her cell phone throughout much of the very lengthy office visit, seemingly oblivious to posted signs prohibiting use of cell phones    Vitals reviewed.   Results for orders placed or performed in visit on 05/22/18  CBC with Differential/Platelet  Result Value Ref Range   WBC 11.1 (H) 4.0 - 10.5 K/uL   RBC 4.44 3.87 - 5.11 MIL/uL   Hemoglobin 13.7 12.0 - 15.0 g/dL   HCT 39.6 36.0 - 46.0 %   MCV 89.2 78.0 - 100.0 fL   MCH 30.9 26.0 - 34.0 pg   MCHC 34.6 30.0 - 36.0 g/dL   RDW 12.9 11.5 - 15.5 %   Platelets 532 (H) 150 - 400 K/uL   Neutrophils Relative % 48 %   Neutro Abs 5.2 1.7 - 7.7 K/uL   Lymphocytes Relative 46 %   Lymphs Abs 5.1 (H) 0.7 - 4.0 K/uL   Monocytes Relative 5  %   Monocytes Absolute 0.6 0.1 - 1.0 K/uL   Eosinophils Relative 1 %   Eosinophils Absolute 0.2 0.0 - 0.7 K/uL   Basophils Relative 0 %   Basophils Absolute 0.0 0.0 - 0.1 K/uL  Comprehensive metabolic panel  Result Value Ref Range   Sodium 138 135 - 145 mmol/L   Potassium 3.1 (L) 3.5 - 5.1 mmol/L   Chloride 97 (L) 98 - 111 mmol/L   CO2 28 22 - 32 mmol/L   Glucose, Bld 116 (H) 70 - 99 mg/dL   BUN 13 6 - 20 mg/dL   Creatinine, Ser 0.61 0.44 - 1.00 mg/dL   Calcium 9.6 8.9 -  10.3 mg/dL   Total Protein 8.8 (H) 6.5 - 8.1 g/dL   Albumin 4.1 3.5 - 5.0 g/dL   AST 22 15 - 41 U/L   ALT 18 0 - 44 U/L   Alkaline Phosphatase 93 38 - 126 U/L   Total Bilirubin 0.5 0.3 - 1.2 mg/dL   GFR calc non Af Amer >60 >60 mL/min   GFR calc Af Amer >60 >60 mL/min   Anion gap 13 5 - 15  Cancer antigen 19-9  Result Value Ref Range   CA 19-9 16 0 - 35 U/mL      Assessment & Plan:    Encounter Diagnoses  Name Primary?  . Essential hypertension Yes  . Hypokalemia   . History of total splenectomy   . Depression, unspecified depression type   . Chalazion of left lower eyelid   . Elevated platelet count   . Cough     -rx citalopram.  Pt to continue with her therapisst -pt to continue current medications -recheck K+ and cbc today -Gave rx for antibiotic to take for infection and counseled pt to seek medical care if she starts the antibiotic.  This is current recommendation per up-to-date for those s/p splenectomy. -counseled pt on chalazion.  If it doesn't worsen, she can wait until October when her medicare takes effect to get seen by ophthalmologist for it -counseled pt that lungs are clear and throat looks good.  Recommended warm salt water gargles and delsym (she declined tessalon) -pt to follow up in 3 weeks to recheck mood.  She is to RTO sooner prn

## 2018-06-06 NOTE — Patient Instructions (Signed)
Chalazion A chalazion is a swelling or lump on the eyelid. It can affect the upper or lower eyelid. What are the causes? This condition may be caused by:  Long-lasting (chronic) inflammation of the eyelid glands.  A blocked oil gland in the eyelid.  What are the signs or symptoms? Symptoms of this condition include:  A swelling on the eyelid. The swelling may spread to areas around the eye.  A hard lump on the eyelid. This lump may make it hard to see out of the eye.  How is this diagnosed? This condition is diagnosed with an examination of the eye. How is this treated? This condition is treated by applying a warm compress to the eyelid. If the condition does not improve after two days, it may be treated with:  Surgery.  Medicine that is injected into the chalazion by a health care provider.  Medicine that is applied to the eye.  Follow these instructions at home:  Do not touch the chalazion.  Do not try to remove the pus, such as by squeezing the chalazion or sticking it with a pin or needle.  Do not rub your eyes.  Wash your hands often. Dry your hands with a clean towel.  Keep your face, scalp, and eyebrows clean.  Avoid wearing eye makeup.  Apply a warm, moist compress to the eyelid 4-6 times a day for 10-15 minutes at a time. This will help to open any blocked glands and help to reduce redness and swelling.  Apply over-the-counter and prescription medicines only as told by your health care provider.  If the chalazion does not break open (rupture) on its own in a month, return to your health care provider.  Keep all follow-up appointments as told by your health care provider. This is important. Contact a health care provider if:  Your eyelid has not improved in 4 weeks.  Your eyelid is getting worse.  You have a fever.  The chalazion does not rupture on its own with home treatment in a month. Get help right away if:  You have pain in your eye.  Your  vision changes.  The chalazion becomes painful or red  The chalazion gets bigger. This information is not intended to replace advice given to you by your health care provider. Make sure you discuss any questions you have with your health care provider. Document Released: 10/13/2000 Document Revised: 03/23/2016 Document Reviewed: 02/08/2015 Elsevier Interactive Patient Education  2018 Elsevier Inc.   

## 2018-06-07 ENCOUNTER — Other Ambulatory Visit (HOSPITAL_COMMUNITY)
Admission: RE | Admit: 2018-06-07 | Discharge: 2018-06-07 | Disposition: A | Discharge status: Home / Self Care | Payer: No Typology Code available for payment source | Source: Ambulatory Visit | Attending: Physician Assistant | Admitting: Physician Assistant

## 2018-06-07 DIAGNOSIS — Z9081 Acquired absence of spleen: Secondary | ICD-10-CM

## 2018-06-07 DIAGNOSIS — E876 Hypokalemia: Secondary | ICD-10-CM

## 2018-06-07 DIAGNOSIS — I10 Essential (primary) hypertension: Secondary | ICD-10-CM

## 2018-06-07 LAB — CBC WITH DIFFERENTIAL/PLATELET
Basophils Absolute: 0 10*3/uL (ref 0.0–0.1)
Basophils Relative: 0 %
EOS ABS: 0.1 10*3/uL (ref 0.0–0.7)
EOS PCT: 1 %
HCT: 40.3 % (ref 36.0–46.0)
Hemoglobin: 13.7 g/dL (ref 12.0–15.0)
LYMPHS ABS: 4.2 10*3/uL — AB (ref 0.7–4.0)
Lymphocytes Relative: 38 %
MCH: 30.4 pg (ref 26.0–34.0)
MCHC: 34 g/dL (ref 30.0–36.0)
MCV: 89.4 fL (ref 78.0–100.0)
MONO ABS: 0.6 10*3/uL (ref 0.1–1.0)
MONOS PCT: 5 %
Neutro Abs: 6.2 10*3/uL (ref 1.7–7.7)
Neutrophils Relative %: 56 %
PLATELETS: 558 10*3/uL — AB (ref 150–400)
RBC: 4.51 MIL/uL (ref 3.87–5.11)
RDW: 13.3 % (ref 11.5–15.5)
WBC: 11.1 10*3/uL — ABNORMAL HIGH (ref 4.0–10.5)

## 2018-06-07 LAB — BASIC METABOLIC PANEL
Anion gap: 12 (ref 5–15)
BUN: 11 mg/dL (ref 6–20)
CHLORIDE: 95 mmol/L — AB (ref 98–111)
CO2: 31 mmol/L (ref 22–32)
Calcium: 9.4 mg/dL (ref 8.9–10.3)
Creatinine, Ser: 0.69 mg/dL (ref 0.44–1.00)
GFR calc Af Amer: >60 mL/min (ref >60)
GFR calc non Af Amer: >60 mL/min (ref >60)
Glucose, Bld: 182 mg/dL — ABNORMAL HIGH (ref 70–99)
Potassium: 2.7 mmol/L — CL (ref 3.5–5.1)
Sodium: 138 mmol/L (ref 135–145)

## 2018-06-10 ENCOUNTER — Other Ambulatory Visit: Payer: Self-pay | Admitting: Physician Assistant

## 2018-06-10 DIAGNOSIS — E876 Hypokalemia: Secondary | ICD-10-CM

## 2018-06-10 DIAGNOSIS — I10 Essential (primary) hypertension: Secondary | ICD-10-CM

## 2018-06-10 MED ORDER — POTASSIUM CHLORIDE ER 10 MEQ PO TBCR
10.0000 meq | EXTENDED_RELEASE_TABLET | Freq: Three times a day (TID) | ORAL | 0 refills | Status: DC
Start: 2018-06-10 — End: 2024-03-07

## 2018-06-12 ENCOUNTER — Other Ambulatory Visit (HOSPITAL_COMMUNITY)
Admission: RE | Admit: 2018-06-12 | Discharge: 2018-06-12 | Disposition: A | Discharge status: Home / Self Care | Payer: No Typology Code available for payment source | Source: Ambulatory Visit | Attending: Physician Assistant | Admitting: Physician Assistant

## 2018-06-12 DIAGNOSIS — E876 Hypokalemia: Secondary | ICD-10-CM | POA: Insufficient documentation

## 2018-06-12 DIAGNOSIS — I10 Essential (primary) hypertension: Secondary | ICD-10-CM | POA: Insufficient documentation

## 2018-06-12 LAB — POTASSIUM: POTASSIUM: 2.9 mmol/L — AB (ref 3.5–5.1)

## 2018-06-17 ENCOUNTER — Encounter: Payer: Self-pay | Admitting: Physician Assistant

## 2018-06-26 ENCOUNTER — Other Ambulatory Visit (HOSPITAL_COMMUNITY)
Admission: RE | Admit: 2018-06-26 | Discharge: 2018-06-26 | Disposition: A | Discharge status: Home / Self Care | Payer: No Typology Code available for payment source | Source: Ambulatory Visit | Attending: Physician Assistant | Admitting: Physician Assistant

## 2018-06-26 ENCOUNTER — Ambulatory Visit: Payer: Medicaid Other | Admitting: Physician Assistant

## 2018-06-26 ENCOUNTER — Encounter: Payer: Self-pay | Admitting: Physician Assistant

## 2018-06-26 VITALS — BP 140/80 | HR 98 | Temp 97.9°F | Ht 61.0 in | Wt 227.2 lb

## 2018-06-26 DIAGNOSIS — E876 Hypokalemia: Secondary | ICD-10-CM

## 2018-06-26 DIAGNOSIS — J029 Acute pharyngitis, unspecified: Secondary | ICD-10-CM

## 2018-06-26 DIAGNOSIS — F419 Anxiety disorder, unspecified: Secondary | ICD-10-CM

## 2018-06-26 DIAGNOSIS — I10 Essential (primary) hypertension: Secondary | ICD-10-CM | POA: Insufficient documentation

## 2018-06-26 DIAGNOSIS — F32A Depression, unspecified: Secondary | ICD-10-CM

## 2018-06-26 DIAGNOSIS — F329 Major depressive disorder, single episode, unspecified: Secondary | ICD-10-CM

## 2018-06-26 DIAGNOSIS — M7918 Myalgia, other site: Secondary | ICD-10-CM

## 2018-06-26 LAB — POTASSIUM: POTASSIUM: 2.9 mmol/L — AB (ref 3.5–5.1)

## 2018-06-26 MED ORDER — RANITIDINE HCL 300 MG PO TABS: 300.0000 mg | ORAL_TABLET | Freq: Every evening | ORAL | 1 refills | Status: DC | PRN

## 2018-06-26 MED ORDER — MAGIC MOUTHWASH W/LIDOCAINE: 5.0000 mL | Freq: Four times a day (QID) | ORAL | 0 refills | Status: DC | PRN

## 2018-06-26 NOTE — Progress Notes (Signed)
BP 140/80 (BP Location: Left Arm, Patient Position: Sitting, Cuff Size: Large)   Pulse 98   Temp 97.9 F (36.6 C)   Ht 5\' 1"  (1.549 m)   Wt 227 lb 4 oz (103.1 kg)   SpO2 98%   BMI 42.94 kg/m    Subjective:    Patient ID: Dominique Torres, female    DOB: [birth year], 37 y.o.   MRN: [id number]  HPI: Dominique Torres is a 37 y.o. female presenting on 06/26/2018 for Mental Health Problem   HPI   Pt only took the citalopram one time because it made her feel nervous and shaky.  She called and discussed it with the nurse but she didn't try to take it again because she didn't want to feel nervous again.  She says she really feels anxious and needs something.   She is still having tailbone pain.  She says she did not contact surgery as they recommended because she says they will just send her for physical therapy and she doesn't want that at this time.  She is moving her bowels normally and says she has no skin breakdown.  She also says she is still having some sore throat that she has been having since her surgery.  No fevers. No dysphagia.   She says she is feeling well otherwise.    Relevant past medical, surgical, family and social history reviewed and updated as indicated. Interim medical history since our last visit reviewed. Allergies and medications reviewed and updated.   Current Outpatient Medications:  .  amLODipine (NORVASC) 5 MG tablet, TAKE 1 TABLET(5 MG) BY MOUTH DAILY, Disp: 30 tablet, Rfl: 1 .  aspirin 81 MG chewable tablet, Chew by mouth., Disp: , Rfl:  .  potassium chloride (K-DUR) 10 MEQ tablet, Take 1 tablet (10 mEq total) by mouth 3 (three) times daily. Take as directed, Disp: 180 tablet, Rfl: 0 .  amoxicillin (AMOXIL) 500 MG capsule, Take 1 capsule (500 mg total) by mouth 3 (three) times daily. (Patient not taking: Reported on 06/26/2018), Disp: 21 capsule, Rfl: 0 .  citalopram (CELEXA) 20 MG tablet, Take 1 tablet (20 mg total) by mouth daily. (Patient not taking:  Reported on 06/26/2018), Disp: 30 tablet, Rfl: 0   Review of Systems  Constitutional: Positive for appetite change and fatigue. Negative for chills, diaphoresis, fever and unexpected weight change.  HENT: Positive for sore throat and voice change. Negative for congestion, drooling, ear pain, facial swelling, hearing loss, mouth sores, sneezing and trouble swallowing.   Eyes: Negative for pain, discharge, redness, itching and visual disturbance.  Respiratory: Negative for cough, choking, shortness of breath and wheezing.   Cardiovascular: Negative for chest pain, palpitations and leg swelling.  Gastrointestinal: Negative for abdominal pain, blood in stool, constipation, diarrhea and vomiting.  Endocrine: Positive for cold intolerance and heat intolerance. Negative for polydipsia.  Genitourinary: Negative for decreased urine volume, dysuria and hematuria.  Musculoskeletal: Positive for arthralgias. Negative for back pain and gait problem.  Skin: Negative for rash.  Allergic/Immunologic: Negative for environmental allergies.  Neurological: Negative for seizures, syncope, light-headedness and headaches.  Hematological: Negative for adenopathy.  Psychiatric/Behavioral: Negative for agitation, dysphoric mood and suicidal ideas. The patient is not nervous/anxious.     Per HPI unless specifically indicated above     Objective:    BP 140/80 (BP Location: Left Arm, Patient Position: Sitting, Cuff Size: Large)   Pulse 98   Temp 97.9 F (36.6 C)   Ht 5\' 1"  (1.549 m)  Wt 227 lb 4 oz (103.1 kg)   SpO2 98%   BMI 42.94 kg/m   Wt Readings from Last 3 Encounters:  06/26/18 227 lb 4 oz (103.1 kg)  06/06/18 225 lb 8 oz (102.3 kg)  05/29/18 226 lb 3.2 oz (102.6 kg)    Physical Exam  Constitutional: She is oriented to person, place, and time. She appears well-developed and well-nourished.  HENT:  Head: Normocephalic and atraumatic.  Mouth/Throat: Uvula is midline, oropharynx is clear and moist  and mucous membranes are normal. No trismus in the jaw. No uvula swelling. No posterior oropharyngeal edema or posterior oropharyngeal erythema.  Neck: Neck supple.  Cardiovascular: Normal rate and regular rhythm.  Pulmonary/Chest: Effort normal and breath sounds normal.  Abdominal: Soft. Bowel sounds are normal. She exhibits no mass. There is no hepatosplenomegaly. There is no tenderness.  Surgical scars healing without signs infection  Musculoskeletal: She exhibits no edema.  Pt moving carefully and slowly due to tailbone pain.  Lymphadenopathy:    She has no cervical adenopathy.  Neurological: She is alert and oriented to person, place, and time.  Skin: Skin is warm and dry.  Psychiatric: She has a normal mood and affect. Her behavior is normal.  Vitals reviewed.   Results for orders placed or performed during the hospital encounter of 06/12/18  Potassium  Result Value Ref Range   Potassium 2.9 (L) 3.5 - 5.1 mmol/L      Assessment & Plan:    Encounter Diagnoses  Name Primary?  Marland Kitchen Anxiety Yes  . Depression, unspecified depression type   . Hypokalemia   . Essential hypertension   . Sore throat   . Buttock pain      -Check K+ today -Pt will try the citalopram again with a meal.  She will contact office if she is still having issues with side effects -recommended OTC mederma for scar that she feels is too big -gave pt Back exercises to help with tail-bone pain -For throat pain gave ranitidine and magic mouthwash.  Pt has taken ranitidine in the past without side effects.  Discussed with pt that she may get some numbness sensation from the mouthwash.  Discussed that if this persists, she may need to see ENT for visualization of the throat -discussed with pt that she needs to select new PCP and schedule OV since her medicare starts Oct 1.   -no changes to BP medication.  Will monitor -pt to follow up here in 3 weeks to recheck mood.  RTO sooner prn

## 2018-06-26 NOTE — Patient Instructions (Signed)

## 2018-07-22 ENCOUNTER — Other Ambulatory Visit (HOSPITAL_COMMUNITY)
Admission: RE | Admit: 2018-07-22 | Discharge: 2018-07-22 | Disposition: A | Discharge status: Home / Self Care | Payer: No Typology Code available for payment source | Source: Ambulatory Visit | Attending: Physician Assistant | Admitting: Physician Assistant

## 2018-07-22 ENCOUNTER — Ambulatory Visit: Payer: Medicaid Other | Admitting: Physician Assistant

## 2018-07-22 ENCOUNTER — Encounter: Payer: Self-pay | Admitting: Physician Assistant

## 2018-07-22 VITALS — BP 130/86 | HR 100 | Temp 97.7°F | Ht 61.0 in | Wt 225.8 lb

## 2018-07-22 DIAGNOSIS — E785 Hyperlipidemia, unspecified: Secondary | ICD-10-CM

## 2018-07-22 DIAGNOSIS — E876 Hypokalemia: Secondary | ICD-10-CM

## 2018-07-22 DIAGNOSIS — Z90411 Acquired partial absence of pancreas: Secondary | ICD-10-CM

## 2018-07-22 DIAGNOSIS — I10 Essential (primary) hypertension: Secondary | ICD-10-CM

## 2018-07-22 DIAGNOSIS — Z9081 Acquired absence of spleen: Secondary | ICD-10-CM

## 2018-07-22 DIAGNOSIS — R7989 Other specified abnormal findings of blood chemistry: Secondary | ICD-10-CM

## 2018-07-22 DIAGNOSIS — G8929 Other chronic pain: Secondary | ICD-10-CM

## 2018-07-22 DIAGNOSIS — F32A Depression, unspecified: Secondary | ICD-10-CM

## 2018-07-22 DIAGNOSIS — F419 Anxiety disorder, unspecified: Secondary | ICD-10-CM

## 2018-07-22 DIAGNOSIS — F329 Major depressive disorder, single episode, unspecified: Secondary | ICD-10-CM

## 2018-07-22 LAB — CBC
HCT: 39.5 % (ref 36.0–46.0)
Hemoglobin: 13.7 g/dL (ref 12.0–15.0)
MCH: 30.5 pg (ref 26.0–34.0)
MCHC: 34.7 g/dL (ref 30.0–36.0)
MCV: 88 fL (ref 78.0–100.0)
PLATELETS: 559 10*3/uL — AB (ref 150–400)
RBC: 4.49 MIL/uL (ref 3.87–5.11)
RDW: 14.2 % (ref 11.5–15.5)
WBC: 10.8 10*3/uL — AB (ref 4.0–10.5)

## 2018-07-22 LAB — POTASSIUM: Potassium: 3.1 mmol/L — ABNORMAL LOW (ref 3.5–5.1)

## 2018-07-22 MED ORDER — FAMOTIDINE 20 MG PO TABS: 20.0000 mg | ORAL_TABLET | Freq: Two times a day (BID) | ORAL | 0 refills | Status: DC

## 2018-07-22 NOTE — Progress Notes (Signed)
BP 130/86 (BP Location: Left Arm, Patient Position: Sitting, Cuff Size: Normal)   Pulse 100   Temp 97.7 F (36.5 C)   Ht 5\' 1"  (1.549 m)   Wt 225 lb 12 oz (102.4 kg)   SpO2 97%   BMI 42.66 kg/m    Subjective:    Patient ID: Dominique Torres, female    DOB: 18-Aug-1981, 37 y.o.   MRN: [id number]  HPI: Dominique Torres is a 37 y.o. female presenting on 07/22/2018 for Mental Health Problem   HPI   Pt has appointment with [facility] for ongoing medical care in early October.  Her medicare starts October 1.    Pt tried the citalopram again and it made her feel "not herself".    Pt says she thinks she needs to see a psychiatrist.    Pt says the ranitidine is helping her sore throat.  She didn't get the magic mouthwash due to cost.  She says her tailbbone is still sore from when she had her surgery.  She did not contact the surgeon back because she says there isn't anything he can do- she just needs physical therapy    Pt says she has no new issues today  Relevant past medical, surgical, family and social history reviewed and updated as indicated. Interim medical history since our last visit reviewed. Allergies and medications reviewed and updated.    Current Outpatient Medications:  .  amLODipine (NORVASC) 5 MG tablet, TAKE 1 TABLET(5 MG) BY MOUTH DAILY, Disp: 30 tablet, Rfl: 1 .  aspirin 81 MG chewable tablet, Chew by mouth., Disp: , Rfl:  .  potassium chloride (K-DUR) 10 MEQ tablet, Take 1 tablet (10 mEq total) by mouth 3 (three) times daily. Take as directed (Patient taking differently: Take 10 mEq by mouth QID. Take as directed), Disp: 180 tablet, Rfl: 0 .  ranitidine (ZANTAC) 300 MG tablet, Take 1 tablet (300 mg total) by mouth at bedtime as needed for heartburn., Disp: 30 tablet, Rfl: 1 .  amoxicillin (AMOXIL) 500 MG capsule, Take 1 capsule (500 mg total) by mouth 3 (three) times daily. (Patient not taking: Reported on 07/22/2018), Disp: 21 capsule, Rfl: 0 .  citalopram  (CELEXA) 20 MG tablet, Take 1 tablet (20 mg total) by mouth daily. (Patient not taking: Reported on 06/26/2018), Disp: 30 tablet, Rfl: 0 .  magic mouthwash w/lidocaine SOLN, Take 5 mLs by mouth 4 (four) times daily as needed for mouth pain. (Patient not taking: Reported on 07/22/2018), Disp: 120 mL, Rfl: 0  Review of Systems  Constitutional: Positive for appetite change, diaphoresis and fatigue. Negative for chills, fever and unexpected weight change.  HENT: Positive for drooling and voice change. Negative for congestion, dental problem, ear pain, facial swelling, hearing loss, mouth sores, sneezing, sore throat and trouble swallowing.   Eyes: Negative for pain, discharge, redness, itching and visual disturbance.  Respiratory: Positive for shortness of breath. Negative for cough, choking and wheezing.   Cardiovascular: Negative for chest pain, palpitations and leg swelling.  Gastrointestinal: Positive for diarrhea. Negative for abdominal pain, blood in stool, constipation and vomiting.  Endocrine: Positive for cold intolerance and heat intolerance. Negative for polydipsia.  Genitourinary: Negative for decreased urine volume, dysuria and hematuria.  Musculoskeletal: Positive for arthralgias and back pain. Negative for gait problem.  Skin: Negative for rash.  Allergic/Immunologic: Negative for environmental allergies.  Neurological: Negative for seizures, syncope, light-headedness and headaches.  Hematological: Negative for adenopathy.  Psychiatric/Behavioral: Positive for agitation and dysphoric mood. Negative for  suicidal ideas. The patient is nervous/anxious.     Per HPI unless specifically indicated above     Objective:    BP 130/86 (BP Location: Left Arm, Patient Position: Sitting, Cuff Size: Normal)   Pulse 100   Temp 97.7 F (36.5 C)   Ht 5\' 1"  (1.549 m)   Wt 225 lb 12 oz (102.4 kg)   SpO2 97%   BMI 42.66 kg/m   Wt Readings from Last 3 Encounters:  07/22/18 225 lb 12 oz (102.4  kg)  06/26/18 227 lb 4 oz (103.1 kg)  06/06/18 225 lb 8 oz (102.3 kg)    Physical Exam  Constitutional: She is oriented to person, place, and time. She appears well-developed and well-nourished.  HENT:  Head: Normocephalic and atraumatic.  Neck: Neck supple.  Cardiovascular: Normal rate and regular rhythm.  Pulmonary/Chest: Effort normal and breath sounds normal.  Abdominal: Soft. Bowel sounds are normal. She exhibits no mass. There is no hepatosplenomegaly. There is no tenderness.  Musculoskeletal: She exhibits no edema.  Lymphadenopathy:    She has no cervical adenopathy.  Neurological: She is alert and oriented to person, place, and time.  Skin: Skin is warm and dry.  Psychiatric: She has a normal mood and affect. Her behavior is normal.  Vitals reviewed.     Assessment & Plan:    Encounter Diagnoses  Name Primary?  . Essential hypertension Yes  . Hypokalemia   . Elevated platelet count   . Anxiety   . Depression, unspecified depression type   . History of total splenectomy   . History of partial pancreatectomy   . Hyperlipidemia, unspecified hyperlipidemia type   . Other chronic pain   . Morbid obesity (HCC)      -Recheck K+ and platelets. Encouraged pt to discuss further work-up for persistent hypokalemia with her new provider.  She didn't have time here as she was being referred to have the partial pancreatectomy.   -pt will need recheck of a1c in the near future -will discontinue ranitidine and rx pepcid in light of recent problems with the medication -no other medication changes today -pt to keep appointment with new provider as scheduled

## 2018-08-20 ENCOUNTER — Emergency Department (HOSPITAL_COMMUNITY)
Admission: EM | Admit: 2018-08-20 | Discharge: 2018-08-20 | Disposition: A | Discharge status: Home / Self Care | Payer: Medicare Other | Attending: Emergency Medicine | Admitting: Emergency Medicine

## 2018-08-20 ENCOUNTER — Encounter (HOSPITAL_COMMUNITY): Payer: Self-pay | Admitting: Emergency Medicine

## 2018-08-20 ENCOUNTER — Other Ambulatory Visit: Payer: Self-pay

## 2018-08-20 DIAGNOSIS — E119 Type 2 diabetes mellitus without complications: Secondary | ICD-10-CM | POA: Insufficient documentation

## 2018-08-20 DIAGNOSIS — I10 Essential (primary) hypertension: Secondary | ICD-10-CM | POA: Diagnosis not present

## 2018-08-20 DIAGNOSIS — Z7982 Long term (current) use of aspirin: Secondary | ICD-10-CM | POA: Diagnosis not present

## 2018-08-20 DIAGNOSIS — Z79899 Other long term (current) drug therapy: Secondary | ICD-10-CM | POA: Insufficient documentation

## 2018-08-20 DIAGNOSIS — Y9289 Other specified places as the place of occurrence of the external cause: Secondary | ICD-10-CM | POA: Insufficient documentation

## 2018-08-20 DIAGNOSIS — Y999 Unspecified external cause status: Secondary | ICD-10-CM | POA: Diagnosis not present

## 2018-08-20 DIAGNOSIS — M79602 Pain in left arm: Secondary | ICD-10-CM | POA: Diagnosis present

## 2018-08-20 DIAGNOSIS — Y9389 Activity, other specified: Secondary | ICD-10-CM | POA: Insufficient documentation

## 2018-08-20 DIAGNOSIS — X500XXA Overexertion from strenuous movement or load, initial encounter: Secondary | ICD-10-CM | POA: Diagnosis not present

## 2018-08-20 DIAGNOSIS — M797 Fibromyalgia: Secondary | ICD-10-CM | POA: Insufficient documentation

## 2018-08-20 DIAGNOSIS — Z8739 Personal history of other diseases of the musculoskeletal system and connective tissue: Secondary | ICD-10-CM

## 2018-08-20 DIAGNOSIS — S46912A Strain of unspecified muscle, fascia and tendon at shoulder and upper arm level, left arm, initial encounter: Secondary | ICD-10-CM | POA: Insufficient documentation

## 2018-08-20 HISTORY — DX: Other chronic pain: G89.29

## 2018-08-20 HISTORY — DX: Unspecified osteoarthritis, unspecified site: M19.90

## 2018-08-20 HISTORY — DX: Type 2 diabetes mellitus without complications: E11.9

## 2018-08-20 HISTORY — DX: Epistaxis: R04.0

## 2018-08-20 MED ORDER — DEXAMETHASONE SODIUM PHOSPHATE 10 MG/ML IJ SOLN
10.0000 mg | Freq: Once | INTRAMUSCULAR | Status: AC
  Administered 2018-08-20: 10 mg via INTRAMUSCULAR
  Filled 2018-08-20: qty 1

## 2018-08-20 MED ORDER — DEXAMETHASONE 4 MG PO TABS: 4.0000 mg | ORAL_TABLET | Freq: Two times a day (BID) | ORAL | 0 refills | Status: DC

## 2018-08-20 MED ORDER — KETOROLAC TROMETHAMINE 10 MG PO TABS
10.0000 mg | ORAL_TABLET | Freq: Once | ORAL | Status: AC
  Administered 2018-08-20: 10 mg via ORAL
  Filled 2018-08-20: qty 1

## 2018-08-20 MED ORDER — PROMETHAZINE HCL 12.5 MG PO TABS
12.5000 mg | ORAL_TABLET | Freq: Once | ORAL | Status: AC
  Administered 2018-08-20: 12.5 mg via ORAL
  Filled 2018-08-20: qty 1

## 2018-08-20 MED ORDER — DICLOFENAC SODIUM 75 MG PO TBEC: 75.0000 mg | DELAYED_RELEASE_TABLET | Freq: Two times a day (BID) | ORAL | 0 refills | Status: DC

## 2018-08-20 MED ORDER — OXYCODONE-ACETAMINOPHEN 5-325 MG PO TABS
2.0000 | ORAL_TABLET | Freq: Once | ORAL | Status: DC
  Filled 2018-08-20: qty 2

## 2018-08-20 NOTE — ED Provider Notes (Signed)
[facility] EMERGENCY DEPARTMENT Provider Note   CSN: [id number] Arrival date & time: 08/20/18  1607     History   Chief Complaint Chief Complaint  Patient presents with  . Arm Pain    HPI Dominique Torres is a 37 y.o. female.  Patient is a 37 year old female with a history of arthritis and fibromyalgia who presents to the emergency department with a complaint of left arm pain.  The patient states that she recently has been lifting some groceries, and in particular she lifted a very heavy bag of Epson salt.  She says since that time she is not able to lift her arm.  She has pain with movement.  She has been using her pain management medications, but she says the pain seems to be getting worse instead of better.  Today she could not raise her arm at all.  The history is provided by the patient.  Arm Pain  The current episode started 2 days ago. The problem occurs constantly. The problem has been gradually worsening. Pertinent negatives include no chest pain, no abdominal pain and no shortness of breath. Exacerbated by: lifting left arm. Treatments tried: ice and oxycodone. The treatment provided mild relief.    Past Medical History:  Diagnosis Date  . Anxiety   . Arthritis   . Chronic pain   . Diabetes mellitus without complication ([place])   . Epistaxis   . Fibromyalgia   . GERD (gastroesophageal reflux disease)   . Hypercholesterolemia   . Hypertension     Patient Active Problem List   Diagnosis Date Noted  . 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 History of partial pancreatectomy 05/06/2018  . Pancreatic mass 01/16/2018  . Hyperlipidemia 01/15/2018  . Hypokalemia 10/17/2017  . Essential hypertension 10/17/2017    Past Surgical History:  Procedure Laterality Date  . exploratory obgyn surgery    . PANCREATECTOMY       OB History   None      Home Medications    Prior to Admission medications   Medication Sig Start Date End Date Taking? Authorizing  Provider  amLODipine (NORVASC) 5 MG tablet TAKE 1 TABLET(5 MG) BY MOUTH DAILY 06/10/18   Soyla Dryer, PA-C  amoxicillin (AMOXIL) 500 MG capsule Take 1 capsule (500 mg total) by mouth 3 (three) times daily. Patient not taking: Reported on 07/22/2018 06/06/18   Soyla Dryer, PA-C  aspirin 81 MG chewable tablet Chew by mouth.    [provider]  famotidine (PEPCID) 20 MG tablet Take 1 tablet (20 mg total) by mouth 2 (two) times daily. 07/22/18   Soyla Dryer, PA-C  potassium chloride (K-DUR) 10 MEQ tablet Take 1 tablet (10 mEq total) by mouth 3 (three) times daily. Take as directed Patient taking differently: Take 10 mEq by mouth QID. Take as directed 06/10/18   Soyla Dryer, PA-C    Family History Family History  Problem Relation Age of Onset  . Hypertension Mother   . Hypercholesterolemia Mother   . Colon polyps Mother   . Diabetes Father   . Colon cancer Father   . Hypertension Maternal Aunt   . Hyperlipidemia Maternal Aunt   . Stroke Maternal Aunt 54  . Diabetes Paternal Uncle   . Diabetes Paternal Grandfather   . Heart disease Maternal Grandmother        died at age 29  . Stroke Maternal Grandfather        died in his 4s  . Stroke Maternal Aunt 36  paralyzed from stroke    Social History Social History   Tobacco Use  . Smoking status: Never Smoker  . Smokeless tobacco: Never Used  Substance Use Topics  . Alcohol use: No  . Drug use: No     Allergies   Gadolinium derivatives; Wellbutrin [bupropion]; Banana; Biaxin [clarithromycin]; Ciprofloxacin; Cymbalta [duloxetine hcl]; Erythromycin; Gluten meal; Lyrica [pregabalin]; Other; Sulfa antibiotics; and Tramadol   Review of Systems Review of Systems  Constitutional: Negative for activity change.       All ROS Neg except as noted in HPI  HENT: Negative for nosebleeds.   Eyes: Negative for photophobia and discharge.  Respiratory: Negative for cough, shortness of breath and wheezing.     Cardiovascular: Negative for chest pain and palpitations.  Gastrointestinal: Negative for abdominal pain and blood in stool.  Genitourinary: Negative for dysuria, frequency and hematuria.  Musculoskeletal: Positive for arthralgias. Negative for back pain and neck pain.  Skin: Negative.   Neurological: Negative for dizziness, seizures and speech difficulty.  Psychiatric/Behavioral: Negative for confusion and hallucinations.     Physical Exam Updated Vital Signs BP (!) 152/93   Pulse 97   Temp 98.4 F (36.9 C) (Oral)   Resp 18   Ht 5\' 1"  (1.549 m)   Wt 102.1 kg   LMP 07/21/2018 (Approximate)   SpO2 100%   BMI 42.51 kg/m   Physical Exam  Constitutional: She is oriented to person, place, and time. She appears well-developed and well-nourished.  Non-toxic appearance.  HENT:  Head: Normocephalic.  Right Ear: Tympanic membrane and external ear normal.  Left Ear: Tympanic membrane and external ear normal.  Eyes: Pupils are equal, round, and reactive to light. EOM and lids are normal.  Neck: Normal range of motion. Neck supple. Carotid bruit is not present.  Cardiovascular: Normal rate, regular rhythm, normal heart sounds, intact distal pulses and normal pulses.  Pulmonary/Chest: Breath sounds normal. No respiratory distress.  Abdominal: Soft. Bowel sounds are normal. There is no tenderness. There is no guarding.  Musculoskeletal: Normal range of motion.       Left upper arm: She exhibits tenderness. She exhibits no deformity.       Arms: Lymphadenopathy:       Head (right side): No submandibular adenopathy present.       Head (left side): No submandibular adenopathy present.    She has no cervical adenopathy.  Neurological: She is alert and oriented to person, place, and time. She has normal strength. No cranial nerve deficit or sensory deficit.  Skin: Skin is warm and dry.  Psychiatric: She has a normal mood and affect. Her speech is normal.  Nursing note and vitals  reviewed.    ED Treatments / Results  Labs (all labs ordered are listed, but only abnormal results are displayed) Labs Reviewed - No data to display  EKG None  Radiology No results found.  Procedures Procedures (including critical care time)  Medications Ordered in ED Medications - No data to display   Initial Impression / Assessment and Plan / ED Course  I have reviewed the triage vital signs and the nursing notes.  Pertinent labs & imaging results that were available during my care of the patient were reviewed by me and considered in my medical decision making (see chart for details).       Final Clinical Impressions(s) / ED Diagnoses MDM  Vital signs reviewed.  Pulse oximetry is 100% on room air.  Within normal limits by my interpretation.  Patient sustained  injury to the left shoulder possibly after lifting groceries and a heavy bag of Epson salt.  The patient states that she has some muscle and bone discomfort from time to time related to her fibromyalgia, and she states that it is worse when the weather is bad.  (It is raining today)  There is no evidence of dislocation or effusion.  There is tenderness to the anterior left shoulder, the bicep tricep area as well as posterior shoulder just below the scapula.  The examination favors muscle strain.  I discussed with the patient however that there could also be a rotator cuff type injury.  The patient is placed in a sling.  She will use Decadron 2 times daily, diclofenac 2 times daily with food.  Patient is on oxycodone as part of a pain management.  She will continue this medication.  Patient is provided with a sling.  I have asked her to see her primary physician for additional evaluation if not improving.   Final diagnoses:  Shoulder strain, left, initial encounter  History of fibromyalgia    ED Discharge Orders         Ordered    dexamethasone (DECADRON) 4 MG tablet  2 times daily with meals     08/20/18 1223     diclofenac (VOLTAREN) 75 MG EC tablet  2 times daily     08/20/18 1223           Lily Kocher, PA-C 08/20/18 1228    Sherwood Gambler, MD 08/20/18 1458

## 2018-08-20 NOTE — Discharge Instructions (Addendum)
Your examination favors strain of the left shoulder.  Please keep your shoulder warm.  Heating pad may be helpful.  Please use diclofenac and Decadron 2 times daily with food.  Continue your pain management medications as previously prescribed.  Use your sling for comfort.  Please see your physicians at [facility] for additional evaluation if not improving, and to evaluate for any possible rotator cuff injury.

## 2018-08-20 NOTE — ED Triage Notes (Signed)
Pt c/o LT arm pain since Sunday. States pain began after she lifted a bag of epsom salt. Pt reports difficulty lifting arm.

## 2018-09-04 ENCOUNTER — Encounter: Payer: Self-pay | Admitting: *Deleted

## 2018-10-07 ENCOUNTER — Ambulatory Visit (INDEPENDENT_AMBULATORY_CARE_PROVIDER_SITE_OTHER): Payer: Medicare Other | Admitting: Obstetrics & Gynecology

## 2018-10-07 ENCOUNTER — Encounter: Payer: Self-pay | Admitting: Obstetrics & Gynecology

## 2018-10-07 ENCOUNTER — Other Ambulatory Visit (HOSPITAL_COMMUNITY)
Admission: RE | Admit: 2018-10-07 | Discharge: 2018-10-07 | Disposition: A | Discharge status: Home / Self Care | Payer: Medicare Other | Source: Ambulatory Visit | Attending: Obstetrics & Gynecology | Admitting: Obstetrics & Gynecology

## 2018-10-07 VITALS — BP 145/93 | HR 94 | Wt 223.0 lb

## 2018-10-07 DIAGNOSIS — Z3009 Encounter for other general counseling and advice on contraception: Secondary | ICD-10-CM | POA: Diagnosis not present

## 2018-10-07 DIAGNOSIS — Z0000 Encounter for general adult medical examination without abnormal findings: Secondary | ICD-10-CM | POA: Insufficient documentation

## 2018-10-07 DIAGNOSIS — N946 Dysmenorrhea, unspecified: Secondary | ICD-10-CM

## 2018-10-07 DIAGNOSIS — Z01419 Encounter for gynecological examination (general) (routine) without abnormal findings: Secondary | ICD-10-CM

## 2018-10-07 NOTE — Progress Notes (Signed)
Patient ID: Dominique Torres, female   DOB: 09-19-1981, 37 y.o.   MRN: [id number]  Chief Complaint  Patient presents with  . Dysmenorrhea    HPI Dominique Torres is a 37 y.o. female single P0 here today for dysmenorrhea.  She began menarche around age 17 and was started on OCPs and the patch for dysmenorrhea at that time. She was taken off all birth control a few years later after becoming hypertensive. She did not have a period for a few years after stopping birth control. Her next period came when she was approx 37yo and it was irregular, heavy, and accompanied by intense abdominal cramps. She underwent laparoscopic surgery in [place] in 2015 as part of the workup for her sx's which was unremarkable. In June 2019 she underwent splenectomy and pancreatectomy. Since the surgery her periods have lasted 4 weeks at a time and come every 4 weeks. She states that it consists of brown, dark spotting for the majority of the time with one day of bright red bleeding with clots. Her LMP was 09/10/2018 and lasted 2 weeks. She has been taking NSAIDs for relief of her cramping with minimal relief. She is unsure when her last pap smear was but believes it was 4 years ago. She is not currently sexually active.    Past Medical History:  Diagnosis Date  . Anxiety   . Arthritis   . Chronic pain   . Diabetes mellitus without complication ([place])   . Epistaxis   . Fibromyalgia   . GERD (gastroesophageal reflux disease)   . Hypercholesterolemia   . Hypertension   . Pancreatic cancer [facility])     Past Surgical History:  Procedure Laterality Date  . exploratory obgyn surgery    . PANCREATECTOMY      Family History  Problem Relation Age of Onset  . Hypertension Mother   . Hypercholesterolemia Mother   . Colon polyps Mother   . Diabetes Father   . Colon cancer Father   . Hypertension Maternal Aunt   . Hyperlipidemia Maternal Aunt   . Stroke Maternal Aunt 54  . Diabetes Paternal Uncle   . Diabetes Paternal  Grandfather   . Heart disease Maternal Grandmother        died at age 60  . Stroke Maternal Grandfather        died in his 68s  . Stroke Maternal Aunt 54       paralyzed from stroke    Social History Social History   Tobacco Use  . Smoking status: Never Smoker  . Smokeless tobacco: Never Used  Substance Use Topics  . Alcohol use: No  . Drug use: No    Allergies  Allergen Reactions  . Gadolinium Derivatives     Hives,itching, dizziness, burning, and increased fibromyalgia pain  . Wellbutrin [Bupropion]     Severe headaches and dizziness  . Banana Itching    Abdominal pain  . Biaxin [Clarithromycin] Hives and Swelling  . Ciprofloxacin Other (See Comments)    abd pain  . Cymbalta [Duloxetine Hcl] Other (See Comments)    "severe" headaches  . Dexamethasone     Severe stomach pain and headaches   . Erythromycin Hives and Swelling  . Gluten Meal Other (See Comments)    Triggers Fibromayalgia  . Lyrica [Pregabalin] Other (See Comments)    Abdominal pain  . Other     Preservatives and MSG  . Sulfa Antibiotics Other (See Comments)    Childhood allergy  . Tramadol Other (See  Comments)    Lightheadedness and headaches    Current Outpatient Medications  Medication Sig Dispense Refill  . amLODipine (NORVASC) 5 MG tablet TAKE 1 TABLET(5 MG) BY MOUTH DAILY 30 tablet 1  . diclofenac sodium (VOLTAREN) 1 % GEL Apply 2 g topically 2 (two) times daily as needed.    . potassium chloride (K-DUR) 10 MEQ tablet Take 1 tablet (10 mEq total) by mouth 3 (three) times daily. Take as directed (Patient taking differently: Take 20 mEq by mouth 2 (two) times daily. Take as directed) 180 tablet 0  . amoxicillin (AMOXIL) 500 MG capsule Take 1 capsule (500 mg total) by mouth 3 (three) times daily. (Patient not taking: Reported on 07/22/2018) 21 capsule 0  . aspirin 81 MG chewable tablet Chew by mouth.    . dexamethasone (DECADRON) 4 MG tablet Take 1 tablet (4 mg total) by mouth 2 (two) times  daily with a meal. (Patient not taking: Reported on 10/07/2018) 10 tablet 0  . diclofenac (VOLTAREN) 75 MG EC tablet Take 1 tablet (75 mg total) by mouth 2 (two) times daily. (Patient not taking: Reported on 10/07/2018) 12 tablet 0  . famotidine (PEPCID) 20 MG tablet Take 1 tablet (20 mg total) by mouth 2 (two) times daily. (Patient not taking: Reported on 10/07/2018) 60 tablet 0   No current facility-administered medications for this visit.     Review of Systems Review of Systems Abstinent for "a long time" Blood pressure (!) 145/93, pulse 94, weight 223 lb (101.2 kg), last menstrual period 09/10/2018.  Physical Exam Physical Exam Breathing, conversing, and ambulating normally Well nourished, well hydrated Black female, no apparent distress Abd- benign, obese Cervix- nulliparous, old brown blood Data Reviewed   Assessment    Preventative care Dysmenorrhea Irregular bleeding, PCOS    Plan    Pap smear STI testing per patient request Gyn u/s Come back 3 weeks We discussed treatment options- she cannot take OCPs due to her HTN so I would suggest Liletta to help protect her endometrium due to her PCOS. She declines TDAP and flu vaccines       Dominique Torres 10/07/2018, 2:38 PM

## 2018-10-07 NOTE — Progress Notes (Signed)
Had spleenectomy and 80 percent pancreas removed in June 2019 due to pancreatic cancer. After surgery her periods started lasting 3-4wks. Has a period about every 28days. Periods have always been painful with cramping. Pt's screening numbers are high. Pt says she has a provider she sees for anxiety,etc and does not need to see Roselyn Reef

## 2018-10-08 LAB — HEPATITIS B SURFACE ANTIGEN: Hepatitis B Surface Ag: NEGATIVE

## 2018-10-08 LAB — RPR: RPR Ser Ql: NONREACTIVE

## 2018-10-08 LAB — HIV ANTIBODY (ROUTINE TESTING W REFLEX): HIV SCREEN 4TH GENERATION: NONREACTIVE

## 2018-10-08 LAB — HEPATITIS C ANTIBODY

## 2018-10-10 ENCOUNTER — Emergency Department (HOSPITAL_COMMUNITY): Payer: No Typology Code available for payment source

## 2018-10-10 ENCOUNTER — Encounter (HOSPITAL_COMMUNITY): Payer: Self-pay | Admitting: Emergency Medicine

## 2018-10-10 ENCOUNTER — Other Ambulatory Visit: Payer: Self-pay

## 2018-10-10 ENCOUNTER — Emergency Department (HOSPITAL_COMMUNITY)
Admission: EM | Admit: 2018-10-10 | Discharge: 2018-10-10 | Disposition: A | Discharge status: Home / Self Care | Payer: No Typology Code available for payment source | Attending: Emergency Medicine | Admitting: Emergency Medicine

## 2018-10-10 DIAGNOSIS — E119 Type 2 diabetes mellitus without complications: Secondary | ICD-10-CM | POA: Diagnosis not present

## 2018-10-10 DIAGNOSIS — I10 Essential (primary) hypertension: Secondary | ICD-10-CM | POA: Diagnosis not present

## 2018-10-10 DIAGNOSIS — Z79899 Other long term (current) drug therapy: Secondary | ICD-10-CM | POA: Diagnosis not present

## 2018-10-10 DIAGNOSIS — M545 Low back pain: Secondary | ICD-10-CM | POA: Insufficient documentation

## 2018-10-10 DIAGNOSIS — M542 Cervicalgia: Secondary | ICD-10-CM | POA: Diagnosis not present

## 2018-10-10 LAB — CYTOLOGY - PAP
Chlamydia: NEGATIVE
DIAGNOSIS: NEGATIVE
HPV (WINDOPATH): NOT DETECTED
NEISSERIA GONORRHEA: NEGATIVE

## 2018-10-10 MED ORDER — DIAZEPAM 5 MG PO TABS
5.0000 mg | ORAL_TABLET | Freq: Once | ORAL | Status: AC
  Administered 2018-10-10: 5 mg via ORAL
  Filled 2018-10-10: qty 1

## 2018-10-10 MED ORDER — METHOCARBAMOL 500 MG PO TABS: 500.0000 mg | ORAL_TABLET | Freq: Three times a day (TID) | ORAL | 0 refills | Status: DC

## 2018-10-10 MED ORDER — KETOROLAC TROMETHAMINE 10 MG PO TABS
10.0000 mg | ORAL_TABLET | Freq: Once | ORAL | Status: AC
  Administered 2018-10-10: 10 mg via ORAL
  Filled 2018-10-10: qty 1
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
## 2018-10-10 NOTE — Discharge Instructions (Addendum)
Alternate ice and heat to your neck and low back.  Avoid heavy lifting or twisting.  Follow-up with your primary care provider for recheck.
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
## 2018-10-15 ENCOUNTER — Ambulatory Visit (HOSPITAL_COMMUNITY): Payer: Medicare Other

## 2018-10-28 ENCOUNTER — Ambulatory Visit (HOSPITAL_COMMUNITY)
Admission: RE | Admit: 2018-10-28 | Discharge: 2018-10-28 | Disposition: A | Discharge status: Home / Self Care | Payer: Medicare Other | Source: Ambulatory Visit | Attending: Obstetrics & Gynecology | Admitting: Obstetrics & Gynecology

## 2018-10-28 DIAGNOSIS — N946 Dysmenorrhea, unspecified: Secondary | ICD-10-CM

## 2019-01-29 ENCOUNTER — Ambulatory Visit (INDEPENDENT_AMBULATORY_CARE_PROVIDER_SITE_OTHER): Payer: Medicare Other | Admitting: Obstetrics & Gynecology

## 2019-01-29 ENCOUNTER — Other Ambulatory Visit: Payer: Self-pay

## 2019-01-29 DIAGNOSIS — N946 Dysmenorrhea, unspecified: Secondary | ICD-10-CM | POA: Diagnosis not present

## 2019-01-29 DIAGNOSIS — N938 Other specified abnormal uterine and vaginal bleeding: Secondary | ICD-10-CM

## 2019-01-29 MED ORDER — MISOPROSTOL 200 MCG PO TABS: ORAL_TABLET | ORAL | 0 refills | Status: DC

## 2019-01-29 NOTE — Progress Notes (Addendum)
   TELEHEALTH VIRTUAL GYNECOLOGY VISIT ENCOUNTER NOTE  I connected with Dominique Torres on 01/29/19 at  1:15 PM EDT by telephone at home and verified that I am speaking with the correct person using two identifiers.   I discussed the limitations, risks, security and privacy concerns of performing an evaluation and management service by telephone and the availability of in person appointments. I also discussed with the patient that there may be a patient responsible charge related to this service. The patient expressed understanding and agreed to proceed.   History:  Dominique Torres is a 38 y.o. G0P0000 female being evaluated today for pelvic pain and DUB. She has bled for almost the last 30 days. She reports a normal blood count about 2 weeks ago. She denies any abnormal vaginal discharge, bleeding, pelvic pain or other concerns.       Past Medical History:  Diagnosis Date  . Anxiety   . Arthritis   . Chronic pain   . Diabetes mellitus without complication ([place])   . Epistaxis   . Fibromyalgia   . GERD (gastroesophageal reflux disease)   . Hypercholesterolemia   . Hypertension   . Pancreatic cancer [facility])    Past Surgical History:  Procedure Laterality Date  . exploratory obgyn surgery    . PANCREATECTOMY     The following portions of the patient's history were reviewed and updated as appropriate: allergies, current medications, past family history, past medical history, past social history, past surgical history and problem list.   Health Maintenance:  Normal pap and negative HRHPV on 12/19.   Review of Systems:  Pertinent items noted in HPI and remainder of comprehensive ROS otherwise negative. She has been abstinent for about 3 years.  Physical Exam:  Physical exam deferred due to nature of the encounter  Labs and Imaging No results found for this or any previous visit (from the past 336 hour(s)). No results found.    Assessment and Plan:    DUB- rec EMBX in about 3 months  due to immunocompromised state due to her h/o splenectomy.  I will pretreat with cytotec 400 mcg.  If she knows that she wants the LnIUD by the next visit, I could do it right after the [facility]. There are no diagnoses linked to this encounter.      I discussed the assessment and treatment plan with the patient. The patient was provided an opportunity to ask questions and all were answered. The patient agreed with the plan and demonstrated an understanding of the instructions.   The patient was advised to call back or seek an in-person evaluation/go to the ED if the symptoms worsen or if the condition fails to improve as anticipated.  I provided 10 minutes of non-face-to-face time during this encounter.   Emily Filbert, MD [facility] [employer] [place]

## 2019-01-29 NOTE — Addendum Note (Signed)
Addended by: Emily Filbert on: 01/29/2019 12:09 PM   Modules accepted: Orders

## 2019-06-05 ENCOUNTER — Telehealth: Payer: Medicare Other | Admitting: Nutrition

## 2019-07-01 ENCOUNTER — Encounter: Payer: Medicare Other | Attending: Physician Assistant | Admitting: Registered"

## 2019-07-01 DIAGNOSIS — E119 Type 2 diabetes mellitus without complications: Secondary | ICD-10-CM | POA: Insufficient documentation

## 2019-07-02 NOTE — Progress Notes (Signed)
This visit was completed via telephone due to the COVID-19 pandemic.   I spoke with Dominique Torres and verified that I was speaking with the correct person with two patient identifiers (full name and date of birth).   I discussed the limitations related to this kind of visit and the patient is willing to proceed.   Medical Nutrition Therapy:  Appt start time: 8:18 end time:  9:10.  Assessment:  Primary concerns today: States she has had DM since 2019 as a result from 70% pancreas being removed. Reports family history of diabetes. Also reports trying to lose weight; has lost 60 lbs already.    Per referral, recent A1C was 8.3. Pt states it began as 10.2, decreased to 7.3, and recently increased to 8.3. States she has recently been doing intermittent fasting. States she does not consume dairy because body doesn't respond well to it. States she is having sugar cravings and she is battling with this. States she wants more control. States her mind is always thinking about "don't do this" or "don't eat too much of that". States she can sometimes binge on carbs-ex small fry + bacon McDouble + 6 piece nuggets and then feels guilty and shameful afterwards. Notices it when she is emotional and states it may happen a couple times per week. States she is emotional related to being diabetic and feeling pressure of doing everything right. States her weight loss goal is to be in remission with diabetes and to feel good.   Pt states she needs to know how often to eat and timing of eating.   Preferred Learning Style:   Visual  Learning Readiness:   Ready  Change in progress   MEDICATIONS: See list   DIETARY INTAKE:  Usual eating pattern includes 3 meals and 2-3 snacks per day.  Everyday foods include fruit, juice, veggies, Lil Debbie cakes, cookies, almond milk, water, salmon, and cereal.  Avoided foods include none stated.    24-hr recall:  B ( AM): juice + fruit + cereal (Special K)  Snk ( AM):    L ( PM): baked salmon + 1/2 c beans + stir fry vegetables or tuna + sweet potato (small portion) + stir fry vegetables Snk ( PM):  D ( PM): baked salmon + 1/2 c beans + stir fry vegetables or tuna + sweet potato (small portion) + stir fry vegetables Snk ( PM): Lil Debbie cakes or cookies or cupcake Beverages: juice, almond milk, water  Usual physical activity: none stated  Estimated energy needs: 1800 calories 200 g carbohydrates 135 g protein 50 g fat  Progress Towards Goal(s):  In progress.   Nutritional Diagnosis:  NB-1.1 Food and nutrition-related knowledge deficit As related to diabetes.  As evidenced by pt verbalizes no previous nutrition education.    Intervention:  Nutrition education and counseling. Pt was educated and counseled on the importance of checking BS throughout the day as well as receiving diabetes education. Also discussed binge-eating disorder and feelings associated with it. Discussed having balanced meals and snacks. Pt will be coming for in-person appt and will do diabetes education along with booklet during that appt. Pt was in agreement with goals listed.  Goals:  - Add protein to breakfast - Remind yourself "there is no perfect way to eat" - Check blood sugar 3-4 times/day.   Blood sugar goals are:   Fasting - less than 130    1-2 hours after meals - less than 180  Teaching Method Utilized:  Visual  Auditory Hands on  Handouts given during visit include:  none  Barriers to learning/adherence to lifestyle change: none identified  Demonstrated degree of understanding via:  Teach Back   Monitoring/Evaluation:  Dietary intake, exercise, and body weight in 1 month(s).

## 2019-07-02 NOTE — Patient Instructions (Signed)
-   Add protein to breakfast  - Remind yourself "there is no perfect way to eat"  - Check blood sugar 3-4 times/day.   Blood sugar goals are:   Fasting - less than 130    1-2 hours after meals - less than 180

## 2019-07-31 ENCOUNTER — Encounter: Payer: Medicare Other | Attending: Physician Assistant | Admitting: Registered"

## 2019-07-31 ENCOUNTER — Encounter: Payer: Self-pay | Admitting: Registered"

## 2019-07-31 ENCOUNTER — Other Ambulatory Visit: Payer: Self-pay

## 2019-07-31 DIAGNOSIS — E119 Type 2 diabetes mellitus without complications: Secondary | ICD-10-CM | POA: Insufficient documentation

## 2019-07-31 NOTE — Progress Notes (Signed)
Diabetes Self-Management Education  Visit Type:  First/Initial  Appt. Start Time: 10:07 Appt. End Time: 11:09  07/31/2019  [name], identified by name and date of birth, is a 38 y.o. female with a diagnosis of Diabetes: Type 2.   ASSESSMENT  Pt states she has had DM since 2019 as a result from 70% pancreas being removed. Reports family history of diabetes. Also reports trying to lose weight; has lost 60 lbs already.    Pt arrives states she has [employer]. States she was becoming obsessed with checking BS throughout the day but has decreased to checking BS 5x/day: FBS (180-200), after meals, and at bedtime. Pt states she does not eat past 7pm. Pt states she believes she has to get weight off due to fibromyalgia and joint pain.   Previous appt: Per referral, recent A1C was 8.3. Pt states it began as 10.2, decreased to 7.3, and recently increased to 8.3. States she has recently been doing intermittent fasting. States she does not consume dairy because body doesn't respond well to it. States she is having sugar cravings and she is battling with this. States she wants more control. States her mind is always thinking about "don't do this" or "don't eat too much of that". States she can sometimes binge on carbs: example-small fry + bacon McDouble + 6 piece nuggets and then feels guilty and shameful afterwards. Notices it when she is emotional and states it may happen a couple times per week. States she is emotional related to being diabetic and feeling pressure of doing everything right. States her weight loss goal is to be in remission with diabetes and to feel good.   Pt states she needs to know how often to eat and timing of eating.   Usual eating pattern includes 3 meals and 2-3 snacks per day.  Everyday foods include fruit, juice, veggies, Lil Debbie cakes, cookies, almond milk, water, salmon, and cereal.  Avoided foods include none stated.    24-hr recall:  B ( AM): juice +  fruit + cereal (Special K)  Snk ( AM):   L ( PM): baked salmon + 1/2 c beans + stir fry vegetables or tuna + sweet potato (small portion) + stir fry vegetables Snk ( PM):  D ( PM): baked salmon + 1/2 c beans + stir fry vegetables or tuna + sweet potato (small portion) + stir fry vegetables Snk ( PM): Lil Debbie cakes or cookies or cupcake Beverages: juice, almond milk, water  Usual physical activity: none stated  There were no vitals taken for this visit. There is no height or weight on file to calculate BMI.   Diabetes Self-Management Education - 07/31/19 1009      Initial Visit   What type of meal plan do you follow?  diabetes plate method      Health Coping   How would you rate your overall health?  Poor      Psychosocial Assessment   Patient Belief/Attitude about Diabetes  Motivated to manage diabetes   overwhelmed, a mix of emotions   Self-care barriers  None    Patient Concerns  Nutrition/Meal planning    Special Needs  None    Preferred Learning Style  No preference indicated    Learning Readiness  Ready      Complications   Last HgB A1C per patient/outside source  8.3 %    How often do you check your blood sugar?  > 4 times/day    Fasting  Blood glucose range (mg/dL)  180-200    Have you had a dilated eye exam in the past 12 months?  No    Have you had a dental exam in the past 12 months?  No    Are you checking your feet?  Yes    How many days per week are you checking your feet?  7      Exercise   How many days per week to you exercise?  3    How many minutes per day do you exercise?  30    Total minutes per week of exercise  90      Patient Education   Previous Diabetes Education  No    Disease state   Definition of diabetes, type 1 and 2, and the diagnosis of diabetes;Factors that contribute to the development of diabetes    Nutrition management   Role of diet in the treatment of diabetes and the relationship between the three main macronutrients and blood  glucose level    Medications  Reviewed patients medication for diabetes, action, purpose, timing of dose and side effects.    Monitoring  Purpose and frequency of SMBG.    Chronic complications  Relationship between chronic complications and blood glucose control;Reviewed with patient heart disease, higher risk of, and prevention      Individualized Goals (developed by patient)   Medications  take my medication as prescribed    Monitoring   test my blood glucose as discussed      Post-Education Assessment   Patient understands the diabetes disease and treatment process.  Demonstrates understanding / competency    Patient understands incorporating nutritional management into lifestyle.  Needs Review    Patient undertands incorporating physical activity into lifestyle.  Needs Review    Patient understands using medications safely.  Demonstrates understanding / competency    Patient understands monitoring blood glucose, interpreting and using results  Needs Review    Patient understands prevention, detection, and treatment of acute complications.  Needs Instruction    Patient understands prevention, detection, and treatment of chronic complications.  Needs Instruction    Patient understands how to develop strategies to address psychosocial issues.  Needs Instruction    Patient understands how to develop strategies to promote health/change behavior.  Needs Instruction      Outcomes   Program Status  Not Completed       Learning Objective:  Patient will have a greater understanding of diabetes self-management. Patient education plan is to attend individual and/or group sessions per assessed needs and concerns.   Plan:   Patient Instructions  - Diabetes can be managed well.   - You can do this! You have support to help you with this!  - Have bedtime snack of carbohdyrates + protein such as:  greek yogurt   fruit + nuts  fruit + cheese  peanut butter toast       Expected  Outcomes:  Demonstrated interest in learning. Expect positive outcomes  Education material provided: ADA - How to Thrive: A Guide for Your Journey with Diabetes  If problems or questions, patient to contact team via:  Phone and Email  Future DSME appointment: - 4-6 wks

## 2019-07-31 NOTE — Patient Instructions (Addendum)
-   Diabetes can be managed well.   - You can do this! You have support to help you with this!  - Have bedtime snack of carbohdyrates + protein such as:  greek yogurt   fruit + nuts  fruit + cheese  peanut butter toast

## 2019-08-29 ENCOUNTER — Other Ambulatory Visit: Payer: Self-pay

## 2019-08-29 ENCOUNTER — Encounter: Payer: Medicare Other | Admitting: Registered"

## 2019-08-29 DIAGNOSIS — E119 Type 2 diabetes mellitus without complications: Secondary | ICD-10-CM | POA: Diagnosis not present

## 2019-08-29 NOTE — Patient Instructions (Addendum)
-   Keep food diary and track blood sugar numbers.

## 2019-08-29 NOTE — Progress Notes (Signed)
  Medical Nutrition Therapy:  Appt start time: 11:00 end time:  12:10.   Assessment:  Primary concerns today: Pt state she has been frustrated since previous appt. States she is trying to watch cholesterol with limited fat intake. Does not eat salad dressing. States her cravings are all the time-mainly carbs. 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 Reports mom binge eats on a regular. Reports recent mental diagnosis: anxiety, bipoloar disorder 1, ADHD, and possibly depression.   Reports her FBS is high every morning no matter what she does the night before. States she has major spikes when eating an apple. Reports FBS (218-277).   Therapist: [name]   Preferred Learning Style:   No preference indicated   Learning Readiness:   Ready  Change in progress   MEDICATIONS: See list   DIETARY INTAKE:  Usual eating pattern includes 3 meals and 2 snacks per day.  Everyday foods include fruit, tortillas, deli meat, [place] bacon, protein drink.  Avoided foods include none listed.    24 hour recall:  B: Atkins protein drink (12 g CHO) + 4 slices of [place] bacon + cheese + berries (160-180) S: [place] balance breaks + water or 1/2 serving skinny pop popcorn + cheese L: 2 low carb tortillas (8 g CHO) + lettuce + cheese + chicken + beans or salad S: D: low-carb tortilla (4 g CHO) + cheese + deli ham + artichoke spread + salad  S: sugar-free cupcake  Usual physical activity: none listed  Estimated energy needs: 1800-2000 calories 200-225 g carbohydrates 135-150 g protein 50-56 g fat  Progress Towards Goal(s):  In progress.   Nutritional Diagnosis:  NB-1.1 Food and nutrition-related knowledge deficit As related to diabetes.  As evidenced by pt verbalizes incomplete information.    Intervention:  Nutrition education and counseling. Pt was educated and counseled on the benefits of having therapist related to eating. Discussed how to track blood sugar numbers with  food intake. Encouraged pt to keep up the great work with changes already made. Pt was in agreement with goals listed.  Goals: - Keep food diary and track blood sugar numbers.   Teaching Method Utilized:  Visual Auditory Hands on  Handouts given during visit include:  none  Barriers to learning/adherence to lifestyle change: disordered eating patterns and thoughts, episodes of binge eating  Demonstrated degree of understanding via:  Teach Back   Monitoring/Evaluation:  Dietary intake, exercise, and body weight in 3 week(s).

## 2019-09-19 ENCOUNTER — Ambulatory Visit: Payer: Medicare Other | Admitting: Registered"

## 2019-10-02 ENCOUNTER — Ambulatory Visit: Payer: Medicare Other | Admitting: Registered"

## 2019-10-09 ENCOUNTER — Encounter: Payer: Medicare Other | Admitting: Registered"

## 2019-10-09 ENCOUNTER — Encounter: Payer: Self-pay | Admitting: Registered"

## 2019-10-09 DIAGNOSIS — E119 Type 2 diabetes mellitus without complications: Secondary | ICD-10-CM | POA: Insufficient documentation

## 2019-10-09 NOTE — Progress Notes (Signed)
This visit was completed via telephone due to the COVID-19 pandemic.   I spoke with Dominique Torres and verified that I was speaking with the correct person with two patient identifiers (full name and date of birth).   I discussed the limitations related to this kind of visit and the patient is willing to proceed.  Medical Nutrition Therapy:  Appt start time: 2:14 end time:  2:57  This note is not being shared with the patient for the following reason: To prevent harm (release of this note would result in harm to the life or physical safety of the patient or another).  Assessment:  Primary concerns today: Pt states she is doing ok. Had a good Thanksgiving and did not feel any pressure to be perfect with eating. Reports recent A1c increased to 9.3. States she has started taking pioglitazone to help get BS numbers to desirable range and hopes to stop taking it. States has increased physical activity with walking 20-30 min, 3-4x/week. States she is eating about every 3 hours to help manage BS and eating better. Reports she continues to hide food although she lives alone. Unsure of why she hides it. Still sees therapist bi-weekly. States she has not talked with her therapist about emotional eating yet. States before going to bed her choices go haywire.   Reports talking her her mom about this and mom states she used to do the same thing when she was grieving.    Therapist: [name]   Preferred Learning Style:   No preference indicated   Learning Readiness:   Ready  Change in progress   MEDICATIONS: See list   DIETARY INTAKE:  Usual eating pattern includes 3 meals and 2 snacks per day.  Everyday foods include fruit, tortillas, deli meat, [place] bacon, protein drink. Avoided foods include none listed.    24 hour recall:  B: light and fit yogurt + 2 slices of [place] bacon + whole grain bread + egg yolk + 2 egg whites S: Glucerna L: 1 1/4 c green beans + sauteed shrimp +  carbohydrate S:  D: 1 1/4 c green beans + sauteed shrimp + carbohydrate S:  Beverages:   Usual physical activity: walking 20-30 min, 3-4x/week  Estimated energy needs: 1800-2000 calories 200-225 g carbohydrates 135-150 g protein 50-56 g fat  Progress Towards Goal(s):  In progress.   Nutritional Diagnosis:  NB-1.1 Food and nutrition-related knowledge deficit As related to diabetes.  As evidenced by pt verbalizes incomplete information.    Intervention:  Nutrition education and counseling. Mainly listened. Pt was educated and counseled on the benefits of having therapist related to eating and discussing behaviors of hiding food in the home with her. Encouraged pt to keep up the great work with changes already made increasing physical activity and working to balance meals. Discussed adequately nourishing the body. Discussed eating disorders and genetic component relationship. Discussed value of nourishing the body and benefits of having a variety of nutrient sources. Pt was in agreement with goals listed.   Teaching Method Utilized:  Visual Auditory Hands on  Handouts given during visit include:  none  Barriers to learning/adherence to lifestyle change: disordered eating patterns and thoughts, episodes of binge eating  Demonstrated degree of understanding via:  Teach Back   Monitoring/Evaluation:  Dietary intake, exercise, and body weight in 1 month(s).

## 2019-11-11 ENCOUNTER — Encounter: Payer: Self-pay | Admitting: Registered"

## 2019-11-11 ENCOUNTER — Encounter: Payer: Medicare Other | Attending: Physician Assistant | Admitting: Registered"

## 2019-11-11 DIAGNOSIS — E119 Type 2 diabetes mellitus without complications: Secondary | ICD-10-CM | POA: Diagnosis not present

## 2019-11-11 NOTE — Progress Notes (Signed)
This visit was completed virtually due to the COVID-19 pandemic.   I spoke with Dominique Torres and verified that I was speaking with the correct person with two patient identifiers (full name and date of birth).   I discussed the limitations related to this kind of visit and the patient is willing to proceed.  Medical Nutrition Therapy:  Appt start time: 10:30 end time: 11:30  This note is not being shared with the patient for the following reason: To prevent harm (release of this note would result in harm to the life or physical safety of the patient or another).  Therapist: [name] ([facility])  Assessment:  Primary concerns today: Pt states she wants to speak with her doctor about medication questions.   Pt states she does not want to continue taking medications that may cause her to gain weight. States she measured her lunch and still felt hungry afterwards. States she mainly grabs sugar when she's hungry. States she cut up her doughnut into pieces, told herself she was only [place] have a few pieces, and had the whole thing. States she tried cottage cheese and enjoys it with fruit. States yesterday was an emotional day for her. States she recognizes that she eats when bored, excited, feeling overwhelmed to emotions, feeling overwhelmed due to procrastination, and having PTSD flashbacks. States she has started taking a mood stabilizer and taking mental health classes which are helping her with coping skills. States she shared with therapist about eating disorder.   Previous appt: Reports recent A1c increased to 9.3. States she has started taking pioglitazone to help get BS numbers to desirable range and hopes to stop taking it. States has increased physical activity with walking 20-30 min, 3-4x/week. States she is eating about every 3 hours to help manage BS and eating better. Reports she continues to hide food although she lives alone. Unsure of why she hides it. Still sees  therapist bi-weekly.    Preferred Learning Style:   No preference indicated   Learning Readiness:   Ready  Change in progress   MEDICATIONS: See list   DIETARY INTAKE:  Usual eating pattern includes 3 meals and 2 snacks per day.  Everyday foods include fruit, tortillas, deli meat, [place] bacon, protein drink. Avoided foods include none listed.    24 hour recall:  B (9 am): Equate diabetes nutritional shake S: 2 pack-meat and cheese roll-ups + 2 potato wedges L (12 pm): sweet potato fries + green beans + stir fry (chicken, edamame, mushrooms) + doughnut (cut it up) + water S (3 pm): 3 cookies D (6 pm): sweet potato fries + green beans + stir fry (chicken, edamame, mushrooms) + grapes S (9 pm): cheese + trail mix + cottage cheese + fruit  Beverages: water (80+ oz), herbal tea (sometimes)  Usual physical activity: walking 20-30 min, 3-4x/week  Estimated energy needs: 1800-2000 calories 200-225 g carbohydrates 135-150 g protein 50-56 g fat  Progress Towards Goal(s):  In progress.   Nutritional Diagnosis:  NB-1.1 Food and nutrition-related knowledge deficit As related to diabetes.  As evidenced by pt verbalizes incomplete information.    Intervention:  Nutrition education and counseling. Mainly listened. Pt was educated and counseled on the benefits of having therapist related to eating. Encouraged pt to keep up the great work with changes already made. Discussed eating disorders being a mental illness.   Teaching Method Utilized:  Visual Auditory Hands on  Handouts given during visit include:  none  Barriers to learning/adherence to  lifestyle change: disordered eating patterns and thoughts, episodes of binge eating  Demonstrated degree of understanding via:  Teach Back   Monitoring/Evaluation:  Dietary intake, exercise, and body weight in 1 month(s).

## 2019-12-18 ENCOUNTER — Telehealth: Payer: Medicare Other | Admitting: Registered"

## 2019-12-25 ENCOUNTER — Encounter: Payer: Medicare Other | Attending: Physician Assistant | Admitting: Registered"

## 2019-12-25 DIAGNOSIS — E119 Type 2 diabetes mellitus without complications: Secondary | ICD-10-CM | POA: Diagnosis present

## 2019-12-25 NOTE — Progress Notes (Signed)
This visit was completed virtually due to the COVID-19 pandemic.   I spoke with Dominique Torres and verified that I was speaking with the correct person with two patient identifiers (full name and date of birth).   I discussed the limitations related to this kind of visit and the patient is willing to proceed.  Medical Nutrition Therapy:  Appt start time: 3:20 end time: 4:17  This note is not being shared with the patient for the following reason: To prevent harm (release of this note would result in harm to the life or physical safety of the patient or another).  Therapist: [name] ([facility])  Assessment:  Primary concerns today: Pt states she has been taking anger management classes. States she may be changing therapists or adding another one to her team. States she realized emotional eating is a learned behavior for her. States she participates in self-harm anger by restricting and/or overeating. States this is a norm for her and didn't realize it was a problem.   Pt states she gained 10 lbs in 3 weeks with diabetes medicine. Stopped taking medication and in process of getting a new prescription. Has not had diabetes medications in 3 weeks; reports higher numbers. States she only sees normal BS numbers when she doesn't eat. Reports she has not been able to walk lately, but denies reasons as to why.   Previous appt: Reports recent A1c increased to 9.3. States she is eating about every 3 hours to help manage BS and eating better. Reports she continues to hide food although she lives alone. Unsure of why she hides it. Still sees therapist bi-weekly. States she recognizes that she eats when bored, excited, feeling overwhelmed to emotions, feeling overwhelmed due to procrastination, and having PTSD flashbacks. States she has started taking a mood stabilizer and taking mental health classes which are helping her with coping skills. States she shared with therapist about eating  disorder.    Preferred Learning Style:   No preference indicated   Learning Readiness:   Ready  Change in progress   MEDICATIONS: See list   DIETARY INTAKE:  Usual eating pattern includes 3 meals and 2 snacks per day.  Everyday foods include fruit, tortillas, deli meat, [place] bacon, protein drink. Avoided foods include none listed.    24 hour recall:  B (9 am): Equate diabetes nutritional shake S: 2 pack-meat and cheese roll-ups + 2 potato wedges L (12 pm): sweet potato fries + green beans + stir fry (chicken, edamame, mushrooms) + doughnut (cut it up) + water S (3 pm): 3 cookies D (6 pm): sweet potato fries + green beans + stir fry (chicken, edamame, mushrooms) + grapes S (9 pm): cheese + trail mix + cottage cheese + fruit  Beverages: water (80+ oz), herbal tea (sometimes)  Usual physical activity: none reported  Estimated energy needs: 1800-2000 calories 200-225 g carbohydrates 135-150 g protein 50-56 g fat  Progress Towards Goal(s):  In progress.   Nutritional Diagnosis:  NB-1.1 Food and nutrition-related knowledge deficit As related to diabetes.  As evidenced by pt verbalizes incomplete information.    Intervention:  Nutrition education and counseling. Mainly listened. Discussed benefits of taking diabetes medications and importance of eating to nourish the body. Discussed benefits of physical activity and how to implement it a few days a week.   Teaching Method Utilized:  Visual Auditory Hands on  Handouts given during visit include:  none  Barriers to learning/adherence to lifestyle change: disordered eating patterns and thoughts,  episodes of binge eating  Demonstrated degree of understanding via:  Teach Back   Monitoring/Evaluation:  Dietary intake, exercise, and body weight in 1 month(s).

## 2020-01-22 ENCOUNTER — Encounter: Payer: Medicare Other | Admitting: Registered"

## 2020-01-22 ENCOUNTER — Encounter: Payer: Medicare Other | Attending: Physician Assistant | Admitting: Registered"

## 2020-01-22 DIAGNOSIS — E119 Type 2 diabetes mellitus without complications: Secondary | ICD-10-CM | POA: Diagnosis not present

## 2020-01-22 NOTE — Progress Notes (Signed)
This visit was completed virtually due to the COVID-19 pandemic.   I spoke with Dominique Torres and verified that I was speaking with the correct person with two patient identifiers (full name and date of birth).   I discussed the limitations related to this kind of visit and the patient is willing to proceed.  Medical Nutrition Therapy:  Appt start time: 3:30 end time: 4:20  This note is not being shared with the patient for the following reason: To prevent harm (release of this note would result in harm to the life or physical safety of the patient or another).  Therapist: Otho Darner [facility])  Assessment:  Primary concerns today:   Pt states she is taking Ozempic. States she will likely have A1c checked in April and May. States she had oral thrush as a result of high BS; has been reducing sugar intake since then. Reports she has started taking Lamictal 3 weeks ago because it does not cause weight gain. States she has been experimenting with different types of food:  replacing dairy with unsweetened coconut milk yogurt and unsweetened almond milk, making plant-based protein shake + a side of vegetables for meals, reduced meat intake. States she has more energy as a result of changes.  States she is having a hard time with snacks. States she feels hungry right after eating. States she eats the serving size of items.   Previous appt: Reports recent A1c increased to 9.3. Reports she continues to hide food although she lives alone. Unsure of why she hides it. Still sees therapist bi-weekly. States she recognizes that she eats when bored, excited, feeling overwhelmed to emotions, feeling overwhelmed due to procrastination, and having PTSD flashbacks. States she has started taking a mood stabilizer and taking mental health classes which are helping her with coping skills. States she shared with therapist about eating disorder.    Preferred Learning Style:   No preference indicated    Learning Readiness:   Ready  Change in progress   MEDICATIONS: See list   DIETARY INTAKE:  Usual eating pattern includes 3 meals and 2 snacks per day.  Everyday foods include fruit, tortillas, deli meat, [place] bacon, protein drink. Avoided foods include none listed.    24 hour recall:  B (9 am): protein shake ([place] + 8 oz unsweetened vanilla almond milk + 3 Tbs coconut milk + 1Tbs lite cool whip) + 1/2 c berries + 2-3 slices of [place] bacon S: 100-calorie trail mix (nuts + fruit) L (12 pm): salad (lentils, onions, tomatoes, greens) + beans/quinoa + shrimp/fish/chicken  S (3 pm): LaffyTaffy D (6 pm): salad (lentils, onions, tomatoes, greens) + beans/quinoa + shrimp/fish/chicken or serving size tortilla chips + shrimp + vegetables + corn + 1/2 pack of M&M's (toffee) S (9 pm):   Beverages: water (80+ oz), herbal tea (sometimes)  Usual physical activity: none reported  Estimated energy needs: 1800-2000 calories 200-225 g carbohydrates 135-150 g protein 50-56 g fat  Progress Towards Goal(s):  In progress.   Nutritional Diagnosis:  NB-1.1 Food and nutrition-related knowledge deficit As related to diabetes.  As evidenced by pt verbalizes incomplete information.    Intervention:  Nutrition education and counseling. Discussed serving sizes, meeting nutritional needs with meals/snacks, and having balanced meals. Discussed there is no perfect way to eat and ways to reduce pressure around eating.   Teaching Method Utilized:  Visual Auditory Hands on  Handouts given during visit include:  none  Barriers to learning/adherence to lifestyle change: disordered eating  patterns and thoughts, episodes of binge eating  Demonstrated degree of understanding via:  Teach Back   Monitoring/Evaluation:  Dietary intake, exercise, and body weight in 1 month(s).

## 2020-02-06 ENCOUNTER — Ambulatory Visit: Payer: Medicare Other

## 2020-02-07 ENCOUNTER — Ambulatory Visit: Payer: Medicare Other | Attending: Internal Medicine

## 2020-02-07 DIAGNOSIS — Z23 Encounter for immunization: Secondary | ICD-10-CM

## 2020-02-07 NOTE — Progress Notes (Signed)
   Covid-19 Vaccination Clinic  Name:  Dominique Torres    MRN: [id number] DOB: [birth year]  02/07/2020  [name] was observed post Covid-19 immunization for 15 minutes .  During the observation period, she experienced an adverse reaction with the following symptoms:  dizziness.  Assessment : Time of assessment 1005. Alert and oriented and Anxious.  Actions taken:  Vitals sign taken  VAERS form obtained and completed by RN.  VS 1005a- BP-143/114 P- 104, O2- 100% jiuce given as patient reports that she has had only apple, recheck VS - BP- 137/103, patient reports that she has HTN and has not taken her meds today.     Medications administered: No medication administered.  Disposition: Reports no further symptoms of adverse reaction after observation for 30 minutes. Discharged home. The Patient was provided with Vaccine Information Sheet and instruction to access the V-Safe system.    Immunizations Administered    Name Date Dose VIS Date Route   Pfizer COVID-19 Vaccine 02/07/2020  9:54 AM 0.3 mL 10/10/2019 Intramuscular   Manufacturer: [place]   Lot: [id number]   [place]: [id number]

## 2020-02-07 NOTE — Progress Notes (Signed)
   Covid-19 Vaccination Clinic  Name:  Dominique Torres    MRN: [id number] DOB: Apr 08, 1981  02/07/2020  [name] was observed post Covid-19 immunization for 30 minutes without incident. She was provided with Vaccine Information Sheet and instruction to access the V-Safe system.   [name] was instructed to call 911 with any severe reactions post vaccine: Marland Kitchen Difficulty breathing  . Swelling of face and throat  . A fast heartbeat  . A bad rash all over body  . Dizziness and weakness   Immunizations Administered    Name Date Dose VIS Date Route   Pfizer COVID-19 Vaccine 02/07/2020  9:54 AM 0.3 mL 10/10/2019 Intramuscular   Manufacturer: [place]   Lot: [id number]   [place]: [id number]

## 2020-02-12 ENCOUNTER — Telehealth: Payer: Self-pay | Admitting: *Deleted

## 2020-02-12 ENCOUNTER — Encounter: Payer: Self-pay | Admitting: *Deleted

## 2020-02-12 NOTE — Telephone Encounter (Signed)
Called patient and left voicemail asking for her to return call to discuss her COVID vaccine that she received on April 10th to see how she was doing. MyChart message sent. Will follow up.

## 2020-02-18 ENCOUNTER — Encounter: Payer: Medicare Other | Admitting: Registered"

## 2020-02-19 NOTE — Telephone Encounter (Signed)
Letter has been mailed to the patient's home.

## 2020-03-02 ENCOUNTER — Ambulatory Visit: Payer: Medicare Other | Attending: Internal Medicine

## 2020-03-02 DIAGNOSIS — Z23 Encounter for immunization: Secondary | ICD-10-CM

## 2020-03-02 NOTE — Progress Notes (Signed)
   Covid-19 Vaccination Clinic  Name:  Dominique Torres    MRN: [id number] DOB: 03-12-1981  03/02/2020  Dominique Torres was observed post Covid-19 immunization for 15 minutes without incident. She was provided with Vaccine Information Sheet and instruction to access the V-Safe system.   Dominique Torres was instructed to call 911 with any severe reactions post vaccine: Marland Kitchen Difficulty breathing  . Swelling of face and throat  . A fast heartbeat  . A bad rash all over body  . Dizziness and weakness   Immunizations Administered    Name Date Dose VIS Date Route   Pfizer COVID-19 Vaccine 03/02/2020 12:08 PM 0.3 mL 12/24/2018 Intramuscular   Manufacturer: [place]   Lot: [id number]   [place]: [id number]

## 2020-03-17 ENCOUNTER — Encounter: Payer: Medicare Other | Attending: Physician Assistant | Admitting: Registered"

## 2020-03-17 DIAGNOSIS — Z713 Dietary counseling and surveillance: Secondary | ICD-10-CM

## 2020-03-17 DIAGNOSIS — E119 Type 2 diabetes mellitus without complications: Secondary | ICD-10-CM | POA: Diagnosis present

## 2020-03-17 NOTE — Progress Notes (Signed)
This visit was completed virtually due to the COVID-19 pandemic.   I spoke with Dominique Torres and verified that I was speaking with the correct person with two patient identifiers (full name and date of birth).   I discussed the limitations related to this kind of visit and the patient is willing to proceed.  Medical Nutrition Therapy:  Appt start time: 2:01 end time: 3:05  Therapist: none currently; states she is taking a break with therapy in because therapist is waiting for certification.  Assessment:  Primary concerns today:   Pt states she has gotten tired of eating fast food. States fast food and tomatoes are causing her to have acid reflux.  States she get BS spikes after eating bread with breakfast; numbers around 250. Reports Ozempic is reducing her appetite and she has not been able to to complete her usual amounts of meals. States she checked BS at 53 and didn't feel any signs/symptoms; made her nervous. Still checking BS multiple times a day: upon waking up (170-200), 9 am/FBS (190), 2 hrs after breakfast, lunch, dinner, and at bedtime.    States she really likes trail mix, skinny pop popcorn, has started having triple zero yogurt due to stevia and not real sugar in it. States she was emotionally eating a lot in April. Since bringing up previous trauma in therapy, eating disorder treatment has been challenging. States it is engrained in her to not have carbohydrates.   Previous appt: Reports recent A1c increased to 9.3. Reports she continues to hide food although she lives alone. Unsure of why she hides it. Still sees therapist bi-weekly. States she recognizes that she eats when bored, excited, feeling overwhelmed to emotions, feeling overwhelmed due to procrastination, and having PTSD flashbacks. States she has started taking a mood stabilizer and taking mental health classes which are helping her with coping skills. States she shared with therapist about eating disorder. States she eats  the serving size of items.    Preferred Learning Style:   No preference indicated   Learning Readiness:   Ready  Change in progress   MEDICATIONS: See list   DIETARY INTAKE:  Usual eating pattern includes 3 meals and 2 snacks per day.  Everyday foods include fruit, tortillas, deli meat, [place] bacon, protein drink. Avoided foods include none listed.    24 hour recall:  B (9 am): protein shake (Glucerna powder + unsweetened vanilla almond milk + ice) + 2 slices of bacon S: [place]-8 grilled nuggets + side salad + sm fries + 1 Reese's cup L (12 pm): roasted vegetables + potatoes + beef smoked sausage S (3 pm): Glucerna powder protein shake + 1 Reese's cup D (6 pm): roasted vegetables + potatoes + beef smoked sausage + garlic breadstick S (9 pm):   Beverages: water (80+ oz), herbal tea (sometimes)  Usual physical activity: none reported  Estimated energy needs: 1800-2000 calories 200-225 g carbohydrates 135-150 g protein 50-56 g fat  Progress Towards Goal(s):  In progress.   Nutritional Diagnosis:  NB-1.1 Food and nutrition-related knowledge deficit As related to diabetes.  As evidenced by pt verbalizes incomplete information.    Intervention:  Nutrition education and counseling. Mainly listened. Shared with pt information about support group for blacks with eating disorders. Encouraged pt there is no perfect way to eat and how trauma can be correlated with eating disorders.  Teaching Method Utilized:  Visual Auditory Hands on  Handouts given during visit include:  none  Barriers to learning/adherence to lifestyle change:  disordered eating patterns and thoughts, episodes of binge eating  Demonstrated degree of understanding via:  Teach Back   Monitoring/Evaluation:  Dietary intake, exercise, and body weight in 1 month(s).

## 2020-04-14 ENCOUNTER — Encounter: Payer: Medicare Other | Attending: Physician Assistant | Admitting: Registered"

## 2020-04-14 DIAGNOSIS — E119 Type 2 diabetes mellitus without complications: Secondary | ICD-10-CM | POA: Diagnosis not present

## 2020-04-14 DIAGNOSIS — Z713 Dietary counseling and surveillance: Secondary | ICD-10-CM

## 2020-04-14 NOTE — Progress Notes (Addendum)
This visit was completed virtually due to the COVID-19 pandemic.   I spoke with Dominique Torres and verified that I was speaking with the correct person with two patient identifiers (full name and date of birth).   I discussed the limitations related to this kind of visit and the patient is willing to proceed.  Medical Nutrition Therapy:  Appt start time: 2:01 end time: 2:50  Therapist: none currently; states she is taking a break with therapy in because therapist is waiting for certification.  Assessment:  Primary concerns today:   States she has been making parfait, fruit with cool whip, granola bar, etc as sweet option during the day. States she notices when having 1/2 candy bar with meal, her BS are in mid-upper 200's; makes her feel defeated. States she wasn't hungry yesterday when she woke up and started her errands and on these days her eating is not typical. States she has not been sleeping well. Will have sleep apnea test within the next 2 months. Reports she has been concerned about finances. Reports she has had 1 binge-eating episode since previous visit. Reports recent mental health concerns are making things more challenging for her. Will discuss with therapist.   Previous appts: Reports recent A1c increased to 9.3. Reports she continues to hide food although she lives alone. Unsure of why she hides it. Still sees therapist bi-weekly. States she recognizes that she eats when bored, excited, feeling overwhelmed to emotions, feeling overwhelmed due to procrastination, and having PTSD flashbacks. States she has started taking a mood stabilizer and taking mental health classes which are helping her with coping skills. States she shared with therapist about eating disorder. States she eats the serving size of items.    Preferred Learning Style:   No preference indicated   Learning Readiness:   Ready  Change in progress   MEDICATIONS: See list   DIETARY INTAKE:  Usual eating  pattern includes 3 meals and 2 snacks per day.  Everyday foods include fruit, tortillas, deli meat, [place] bacon, protein drink. Avoided foods include none listed.    24 hour recall:  B (9 am):  S:  L (1 pm): [employer]-10 pc nuggets + salad (lentils, vegetables, cheese, Green Goddess dressing)  S (3 pm): D (6 pm): [employer] - McChicken sandwich + 4 pc nuggets + sm fries + strawberry creme pie S (9 pm):   Beverages: water (80+ oz), herbal tea (sometimes)  Usual physical activity: none reported  Estimated energy needs: 1800-2000 calories 200-225 g carbohydrates 135-150 g protein 50-56 g fat  Progress Towards Goal(s):  In progress.   Nutritional Diagnosis:  NB-1.1 Food and nutrition-related knowledge deficit As related to diabetes.  As evidenced by pt verbalizes incomplete information.    Intervention:  Nutrition education and counseling. Discussed planning balanced meals related to diabetes and budgeting money related to eating out versus eating meals at home. Mainly listened.   Teaching Method Utilized:  Visual Auditory Hands on  Handouts given during visit include:  Meal Ideas  Barriers to learning/adherence to lifestyle change: disordered eating patterns and thoughts, episodes of binge eating  Demonstrated degree of understanding via:  Teach Back   Monitoring/Evaluation:  Dietary intake, exercise, and body weight in 1 month(s).

## 2020-05-12 ENCOUNTER — Encounter: Payer: Self-pay | Admitting: Registered"

## 2020-05-12 ENCOUNTER — Encounter: Payer: Medicare Other | Attending: Physician Assistant | Admitting: Registered"

## 2020-05-12 DIAGNOSIS — Z713 Dietary counseling and surveillance: Secondary | ICD-10-CM

## 2020-05-12 DIAGNOSIS — E119 Type 2 diabetes mellitus without complications: Secondary | ICD-10-CM | POA: Diagnosis not present

## 2020-05-12 NOTE — Progress Notes (Signed)
This visit was completed virtually due to the COVID-19 pandemic.   I spoke with Dominique Torres and verified that I was speaking with the correct person with two patient identifiers (full name and date of birth).   I discussed the limitations related to this kind of visit and the patient is willing to proceed.  Medical Nutrition Therapy:  Appt start time: 2:02 end time: 2:57  Therapist: none currently; states she is taking a break with therapy in because therapist is waiting for certification.  Assessment:  Primary concerns today:   Pt states she is a little discouraged about Ozempic due to recent increase in price of medication. States it is now $700. Reports she is waiting to hear back from doctor's office related to receiving samples and/or receiving a discount with manufacturers. Reports her pancreas is only working with 30%.   Reports she hides fast food (not restaurant food) in her purse when she is getting out of her car and going inside her home. States she fears that people will judge her even though she knows that nobody is watching her.   Reports her BS are more regulated. States her numbers range from 90-204. Reports this is helping her to feel more confident in food choices. States she is no longer checking BS all throughout the day. Checks fasting and after each meal for a total of 4x/day and not more than this.   Pt reports she is proud of herself for only hiding food from herself once since previous visit. Reports she attended eating disorder support group and thoroughly enjoyed it. Plans to attend monthly.   Previous appts: Reports recent A1c increased to 9.3. Reports she continues to hide food although she lives alone. Unsure of why she hides it. Still sees therapist bi-weekly. States she recognizes that she eats when bored, excited, feeling overwhelmed to emotions, feeling overwhelmed due to procrastination, and having PTSD flashbacks. States she has started taking a mood  stabilizer and taking mental health classes which are helping her with coping skills. States she shared with therapist about eating disorder. States she eats the serving size of items.    Preferred Learning Style:   No preference indicated   Learning Readiness:   Ready  Change in progress   MEDICATIONS: See list   DIETARY INTAKE:  Usual eating pattern includes 3 meals and 2 snacks per day.  Everyday foods include fruit, tortillas, deli meat, [place] bacon, protein drink. Avoided foods include none listed.    24 hour recall:  B (9 am):  S:  L (1 pm): McDonald's-10 pc nuggets + salad (lentils, vegetables, cheese, Green Goddess dressing)  S (3 pm): D (6 pm): [employer] - McChicken sandwich + 4 pc nuggets + sm fries + strawberry creme pie S (9 pm):   Beverages: water (80+ oz), herbal tea (sometimes)  Usual physical activity: none reported  Estimated energy needs: 1800-2000 calories 200-225 g carbohydrates 135-150 g protein 50-56 g fat  Progress Towards Goal(s):  In progress.   Nutritional Diagnosis:  NB-1.1 Food and nutrition-related knowledge deficit As related to diabetes.  As evidenced by pt verbalizes incomplete information.    Intervention:  Nutrition education and counseling. Discussed planning balanced meals related to diabetes and budgeting money related to eating out versus eating meals at home. Mainly listened.   Teaching Method Utilized:  Visual Auditory Hands on  Handouts given during visit include:  None  Barriers to learning/adherence to lifestyle change: disordered eating patterns and thoughts, episodes of binge eating  Demonstrated degree of understanding via:  Teach Back   Monitoring/Evaluation:  Dietary intake, exercise, and body weight in 1 month(s).

## 2020-06-16 ENCOUNTER — Telehealth: Payer: Medicare Other | Admitting: Registered"

## 2020-07-15 ENCOUNTER — Encounter: Payer: Medicare Other | Attending: Physician Assistant | Admitting: Registered"

## 2020-07-15 DIAGNOSIS — E119 Type 2 diabetes mellitus without complications: Secondary | ICD-10-CM | POA: Insufficient documentation

## 2020-08-10 ENCOUNTER — Encounter: Payer: Medicare Other | Attending: Physician Assistant | Admitting: Registered"

## 2020-08-10 DIAGNOSIS — E119 Type 2 diabetes mellitus without complications: Secondary | ICD-10-CM | POA: Insufficient documentation

## 2020-08-31 ENCOUNTER — Telehealth: Payer: Medicare Other | Admitting: Registered"

## 2023-09-03 LAB — HM COLONOSCOPY

## 2023-09-05 ENCOUNTER — Ambulatory Visit: Payer: Medicare Other

## 2023-09-05 DIAGNOSIS — Z23 Encounter for immunization: Secondary | ICD-10-CM | POA: Diagnosis not present

## 2023-09-05 DIAGNOSIS — Z719 Counseling, unspecified: Secondary | ICD-10-CM

## 2023-09-05 NOTE — Progress Notes (Signed)
Pt seen in nurse clinic requesting Men B vaccine, pt states her doctor recommended to have another meningococcal B vaccine because of history of splenectomy 5 years ago. Pt met criteria per [facility] standing order. Administered vaccine, tolerated well. Given VIS and NCIR copy, explained and understood. M.Layci Stenglein, LPN.

## 2024-03-07 ENCOUNTER — Inpatient Hospital Stay: Attending: Oncology | Admitting: Oncology

## 2024-03-07 ENCOUNTER — Inpatient Hospital Stay: Admitting: Oncology

## 2024-03-07 VITALS — BP 140/86 | HR 108 | Temp 98.3°F | Resp 20 | Wt 196.7 lb

## 2024-03-07 DIAGNOSIS — D75839 Thrombocytosis, unspecified: Secondary | ICD-10-CM

## 2024-03-07 DIAGNOSIS — D72825 Bandemia: Secondary | ICD-10-CM

## 2024-03-07 DIAGNOSIS — E611 Iron deficiency: Secondary | ICD-10-CM | POA: Insufficient documentation

## 2024-03-07 DIAGNOSIS — D72829 Elevated white blood cell count, unspecified: Secondary | ICD-10-CM | POA: Diagnosis present

## 2024-03-07 DIAGNOSIS — Z79899 Other long term (current) drug therapy: Secondary | ICD-10-CM | POA: Diagnosis not present

## 2024-03-07 DIAGNOSIS — D649 Anemia, unspecified: Secondary | ICD-10-CM

## 2024-03-07 LAB — CBC WITH DIFFERENTIAL/PLATELET
Abs Immature Granulocytes: 0.02 10*3/uL (ref 0.00–0.07)
Basophils Absolute: 0 10*3/uL (ref 0.0–0.1)
Basophils Relative: 0 %
Eosinophils Absolute: 0.1 10*3/uL (ref 0.0–0.5)
Eosinophils Relative: 0 %
HCT: 35.9 % — ABNORMAL LOW (ref 36.0–46.0)
Hemoglobin: 12.3 g/dL (ref 12.0–15.0)
Immature Granulocytes: 0 %
Lymphocytes Relative: 37 %
Lymphs Abs: 4.3 10*3/uL — ABNORMAL HIGH (ref 0.7–4.0)
MCH: 30.4 pg (ref 26.0–34.0)
MCHC: 34.3 g/dL (ref 30.0–36.0)
MCV: 88.9 fL (ref 80.0–100.0)
Monocytes Absolute: 0.6 10*3/uL (ref 0.1–1.0)
Monocytes Relative: 5 %
Neutro Abs: 6.5 10*3/uL (ref 1.7–7.7)
Neutrophils Relative %: 58 %
Platelets: 601 10*3/uL — ABNORMAL HIGH (ref 150–400)
RBC: 4.04 MIL/uL (ref 3.87–5.11)
RDW: 14.2 % (ref 11.5–15.5)
WBC: 11.6 10*3/uL — ABNORMAL HIGH (ref 4.0–10.5)
nRBC: 0 % (ref 0.0–0.2)

## 2024-03-07 LAB — SEDIMENTATION RATE: Sed Rate: 35 mm/h — ABNORMAL HIGH (ref 0–22)

## 2024-03-07 LAB — LACTATE DEHYDROGENASE: LDH: 116 U/L (ref 98–192)

## 2024-03-07 LAB — C-REACTIVE PROTEIN: CRP: 0.8 mg/dL (ref <1.0)

## 2024-03-07 LAB — IRON AND TIBC
Iron: 30 ug/dL (ref 28–170)
Saturation Ratios: 7 % — ABNORMAL LOW (ref 10.4–31.8)
TIBC: 413 ug/dL (ref 250–450)
UIBC: 383 ug/dL

## 2024-03-07 LAB — FERRITIN: Ferritin: 5 ng/mL — ABNORMAL LOW (ref 11–307)

## 2024-03-07 NOTE — Progress Notes (Signed)
 [facility]  HEMATOLOGY NEW VISIT  [facility]  REASON FOR REFERRAL: Thrombocytosis   HISTORY OF PRESENT ILLNESS: Dominique Torres 43 y.o. female referred for thrombocytosis and leukocytosis.  Patient has a past medical history of colitis, fibromyalgia but is not on any steroids.  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  She had a recent CBC done with her primary care which showed elevated white count and platelet count. She does report a history of colitis causing frequent diarrhea that she manages with diet modifications.  She recently also had slight elevated uric acid levels but does not have any joint symptoms.  The last time she took prednisone was in July of last year.  No fever, chills, or loss of appetite. She experiences night sweats and fatigue, which she attributes to fibromyalgia. Actively trying to lose weight, she has noticed some weight loss. She does not smoke or drink alcohol, lives alone, and has no children. Her menstrual cycles are regular, though she notes heavier bleeding since moving into a new house.  I have reviewed the past medical history, past surgical history, social history and family history with the patient   ALLERGIES:  is allergic to gadolinium derivatives, iodinated contrast media, wellbutrin [bupropion], biaxin [clarithromycin], ciprofloxacin, cymbalta [duloxetine hcl], dexamethasone , erythromycin, gluten meal, lyrica [pregabalin], other, sitagliptin, sulfa antibiotics, tramadol, and shellfish-derived products.  MEDICATIONS:  Current Outpatient Medications  Medication Sig Dispense Refill   ALPRAZolam (XANAX) 1 MG tablet Take 1 mg by mouth 2 (two) times daily as needed.     amLODipine  (NORVASC ) 5 MG tablet TAKE 1 TABLET(5 MG) BY MOUTH DAILY 30 tablet 1   ascorbic acid (VITAMIN C) 500 MG tablet Take 500 mg by mouth.     Cholecalciferol  (VITAMIN D3) 1.25 MG (50000 UT) CAPS Take 1 capsule by mouth once a week.     cholecalciferol (VITAMIN D3) 25 MCG (1000 UT) tablet Take 1,000 Units by mouth daily.     Continuous Glucose Sensor (FREESTYLE LIBRE 14 DAY SENSOR) MISC SMARTSIG:1 Topical As Directed     Ergocalciferol 10 MCG (400 UNIT) TABS Take by mouth.     ezetimibe (ZETIA) 10 MG tablet Take 10 mg by mouth daily.     folic acid (FOLVITE) 1 MG tablet Take 1 mg by mouth.     gabapentin (NEURONTIN) 100 MG capsule Take 100 mg by mouth 3 (three) times daily.     LEXAPRO 10 MG tablet      metFORMIN (GLUCOPHAGE) 1000 MG tablet Take 1,000 mg by mouth 2 (two) times daily.     Potassium Chloride  ER 20 MEQ TBCR Take 1 tablet by mouth daily.     psyllium (METAMUCIL) 58.6 % packet Take 1 packet by mouth daily.     traZODone (DESYREL) 150 MG tablet Take 150 mg by mouth at bedtime as needed.     No current facility-administered medications for this visit.     REVIEW OF SYSTEMS:   Constitutional: Denies fevers, chills or night sweats Eyes: Denies blurriness of vision Ears, nose, mouth, throat, and face: Denies mucositis or sore throat Respiratory: Denies cough, dyspnea or wheezes Cardiovascular: Denies palpitation, chest discomfort or lower extremity swelling Gastrointestinal:  Denies nausea, heartburn or change in bowel habits Skin: Denies abnormal skin rashes Lymphatics: Denies new lymphadenopathy or easy bruising Neurological:Denies numbness, tingling or new weaknesses Behavioral/Psych: Mood is stable, no new changes  All other systems  were reviewed with the patient and are negative.  PHYSICAL EXAMINATION:   Vitals:   03/07/24 0946 03/07/24 0951  BP: (!) 164/109 (!) 140/86  Pulse: (!) 108   Resp: 20   Temp: 98.3 F (36.8 C)   SpO2: 98%     GENERAL:alert, no distress and comfortable SKIN: skin color, texture, turgor are normal, no rashes or significant lesions LYMPH:  no palpable lymphadenopathy in the cervical, axillary  or inguinal LUNGS: clear to auscultation and percussion with normal breathing effort HEART: regular rate & rhythm and no murmurs and no lower extremity edema ABDOMEN:abdomen soft, non-tender and normal bowel sounds Musculoskeletal:no cyanosis of digits and no clubbing  NEURO: alert & oriented x 3 with fluent speech  LABORATORY DATA:  I have reviewed the data as listed and labs received from primary care's office drawn on 02/01/2024 CBC: WBC: 16.8, hemoglobin: 12.5, hematocrit: 37.9, MCV: 92, platelets: 650 CMP: Sodium: 140, potassium: 3, creatinine: 0.8, GFR: 77 Alkaline phosphatase: 136, AST: 15, ALT: 10 Calprotectin: 116 ANA, RF: Negative  Lab Results  Component Value Date   WBC 11.6 (H) 03/07/2024   NEUTROABS 6.5 03/07/2024   HGB 12.3 03/07/2024   HCT 35.9 (L) 03/07/2024   MCV 88.9 03/07/2024   PLT 601 (H) 03/07/2024      Chemistry      Component Value Date/Time   NA 138 06/07/2018 1036   K 3.1 (L) 07/22/2018 1055   CL 95 (L) 06/07/2018 1036   CO2 31 06/07/2018 1036   BUN 11 06/07/2018 1036   CREATININE 0.69 06/07/2018 1036      Component Value Date/Time   CALCIUM  9.4 06/07/2018 1036   ALKPHOS 93 05/22/2018 1131   AST 22 05/22/2018 1131   ALT 18 05/22/2018 1131   BILITOT 0.5 05/22/2018 1131       RADIOGRAPHIC STUDIES: I have personally reviewed the radiological images as listed and agreed with the findings in the report.  None to review   ASSESSMENT & PLAN:  Patient is a 43 y.o. 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  - Will obtain iron labs today to rule out iron deficiency -Will also obtain inflammatory markers including ESR and CRP - No history of blood clots  Continue to monitor counts for now  Leukocytosis Likely reactive secondary to inflammation, mostly neutrophil predominance ANA, RF: Negative UA positive for  leukocytosis Uric acid elevated  - Will obtain inflammatory markers to include ESR and CRP - Will also obtain LDH and flow cytometry to rule out leukemia/lymphoma  Return to clinic in 2 weeks to discuss results and further management   Orders Placed This Encounter  Procedures   Ferritin    Standing Status:   Future    Number of Occurrences:   1    Expected Date:   03/07/2024    Expiration Date:   03/07/2025   CBC with Differential/Platelet    Standing Status:   Future    Number of Occurrences:   1    Expected Date:   03/07/2024    Expiration Date:   03/07/2025   Iron and TIBC    Standing Status:   Future    Number of Occurrences:   1    Expected Date:   03/07/2024    Expiration Date:   03/07/2025   Lactate dehydrogenase    Standing Status:   Future    Number of Occurrences:  1    Expected Date:   03/07/2024    Expiration Date:   03/07/2025   Sedimentation rate    Standing Status:   Future    Number of Occurrences:   1    Expected Date:   03/07/2024    Expiration Date:   03/07/2025   C-reactive protein    Standing Status:   Future    Number of Occurrences:   1    Expected Date:   03/07/2024    Expiration Date:   03/07/2025   Flow Cytometry, Peripheral Blood (Oncology)    Standing Status:   Future    Number of Occurrences:   1    Expected Date:   03/07/2024    Expiration Date:   03/07/2025   Ambulatory referral to Social Work    Referral Priority:   Routine    Referral Type:   Consultation    Referral Reason:   Specialty Services Required    Referred to Provider:   Eduardo Grade, MD    Number of Visits Requested:   1   Ambulatory Referral to [facility] Nutrition    Referral Priority:   Routine    Referral Type:   Consultation    Referral Reason:   Specialty Services Required    Requested Specialty:   Oncology    Number of Visits Requested:   1    The total time spent in the appointment was 60 minutes encounter with patients including review of chart and various tests results, discussions  about plan of care and coordination of care plan   All questions were answered. The patient knows to call the clinic with any problems, questions or concerns. No barriers to learning was detected.   Eduardo Grade, MD 5/9/20252:14 PM

## 2024-03-07 NOTE — Assessment & Plan Note (Signed)
 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

## 2024-03-07 NOTE — Assessment & Plan Note (Addendum)
 Likely reactive secondary to inflammation, mostly neutrophil predominance ANA, RF: Negative UA positive for leukocytosis Uric acid elevated  - Will obtain inflammatory markers to include ESR and CRP - Will also obtain LDH and flow cytometry to rule out leukemia/lymphoma  Return to clinic in 2 weeks to discuss results and further management

## 2024-03-10 ENCOUNTER — Encounter: Payer: Self-pay | Admitting: Oncology

## 2024-03-10 ENCOUNTER — Other Ambulatory Visit: Payer: Self-pay | Admitting: *Deleted

## 2024-03-10 DIAGNOSIS — D649 Anemia, unspecified: Secondary | ICD-10-CM | POA: Insufficient documentation

## 2024-03-10 LAB — SURGICAL PATHOLOGY

## 2024-03-10 NOTE — Addendum Note (Signed)
 Addended by: Rykar Lebleu on: 03/10/2024 11:34 AM   Modules accepted: Orders

## 2024-03-10 NOTE — Progress Notes (Signed)
 Patient notified and scheduled for monoferric infusion.  Verbalized understanding.

## 2024-03-11 ENCOUNTER — Encounter: Payer: Self-pay | Admitting: Oncology

## 2024-03-13 LAB — FLOW CYTOMETRY

## 2024-03-14 ENCOUNTER — Encounter: Payer: Self-pay | Admitting: Oncology

## 2024-03-14 ENCOUNTER — Inpatient Hospital Stay

## 2024-03-14 VITALS — BP 148/90 | HR 95 | Temp 98.4°F | Resp 18 | Ht 61.0 in | Wt 198.0 lb

## 2024-03-14 DIAGNOSIS — E611 Iron deficiency: Secondary | ICD-10-CM | POA: Diagnosis not present

## 2024-03-14 DIAGNOSIS — D649 Anemia, unspecified: Secondary | ICD-10-CM

## 2024-03-14 MED ORDER — ACETAMINOPHEN 325 MG PO TABS
650.0000 mg | ORAL_TABLET | Freq: Once | ORAL | Status: AC
  Administered 2024-03-14: 650 mg via ORAL
  Filled 2024-03-14: qty 2

## 2024-03-14 MED ORDER — SODIUM CHLORIDE 0.9 % IV SOLN
INTRAVENOUS | Status: DC
Start: 2024-03-14 — End: 2024-03-14

## 2024-03-14 MED ORDER — SODIUM CHLORIDE 0.9 % IV SOLN
1000.0000 mg | Freq: Once | INTRAVENOUS | Status: AC
  Administered 2024-03-14: 1000 mg via INTRAVENOUS
  Filled 2024-03-14: qty 1000

## 2024-03-14 MED ORDER — CETIRIZINE HCL 10 MG PO TABS
10.0000 mg | ORAL_TABLET | Freq: Once | ORAL | Status: AC
  Administered 2024-03-14: 10 mg via ORAL
  Filled 2024-03-14: qty 1

## 2024-03-14 NOTE — Progress Notes (Signed)
 Patient presents today for 1st iron infusion. (Monoferric). Patients vital signs within parameters for treatment. Patient given Monoferric information sheet . Side effects discussed with patient. Understanding verbalized. MAR reviewed and updated.   Monoferric given today per MD orders. Tolerated infusion without adverse affects. Vital signs stable. No complaints at this time. Discharged from clinic ambulatory in stable condition. Alert and oriented x 3. F/U with [facility] as scheduled.

## 2024-03-14 NOTE — Patient Instructions (Signed)
 [facility] - A DEPT OF [facility]  Discharge Instructions: Thank you for choosing [facility] to provide your oncology and hematology care.  If you have a lab appointment with the Cancer Center - please note that after April 8th, 2024, all labs will be drawn in the cancer center.  You do not have to check in or register with the main entrance as you have in the past but will complete your check-in in the cancer center.  Wear comfortable clothing and clothing appropriate for easy access to any Portacath or PICC line.   We strive to give you quality time with your provider. You may need to reschedule your appointment if you arrive late (15 or more minutes).  Arriving late affects you and other patients whose appointments are after yours.  Also, if you miss three or more appointments without notifying the office, you may be dismissed from the clinic at the provider's discretion.      For prescription refill requests, have your pharmacy contact our office and allow 72 hours for refills to be completed.    Today you received the following chemotherapy and/or immunotherapy agents Monoferric. Ferric Derisomaltose Injection What is this medication? FERRIC DERISOMALTOSE (FER ik der EYE soe MAWL tose) treats low levels of iron in your body (iron deficiency anemia). Iron is a mineral that plays an important role in making red blood cells, which carry oxygen from your lungs to the rest of your body. This medicine may be used for other purposes; ask your health care provider or pharmacist if you have questions. COMMON BRAND NAME(S): MONOFERRIC What should I tell my care team before I take this medication? They need to know if you have any of these conditions: High levels of iron in the blood An unusual or allergic reaction to iron, other medications, foods, dyes, or preservatives Pregnant or trying to get pregnant Breastfeeding How should I use this  medication? This medication is injected into a vein. It is given by your care team in a hospital or clinic setting. Talk to your care team about the use of this medication in children. Special care may be needed. Overdosage: If you think you have taken too much of this medicine contact a poison control center or emergency room at once. NOTE: This medicine is only for you. Do not share this medicine with others. What if I miss a dose? It is important not to miss your dose. Call your care team if you are unable to keep an appointment. What may interact with this medication? Do not take this medication with any of the following: Deferoxamine Dimercaprol Other iron products This list may not describe all possible interactions. Give your health care provider a list of all the medicines, herbs, non-prescription drugs, or dietary supplements you use. Also tell them if you smoke, drink alcohol, or use illegal drugs. Some items may interact with your medicine. What should I watch for while using this medication? Visit your care team for regular checks on your progress. Tell your care team if your symptoms do not start to get better or if they get worse. You may need blood work done while you are taking this medication. You may need to eat more foods that contain iron. Talk to your care team. Foods that contain iron include whole grains or cereals, dried fruits, beans, peas, leafy green vegetables, and organ meats (liver, kidney). What side effects may I notice from receiving this  medication? Side effects that you should report to your care team as soon as possible: Allergic reactions--skin rash, itching, hives, swelling of the face, lips, tongue, or throat Low blood pressure--dizziness, feeling faint or lightheaded, blurry vision Shortness of breath Side effects that usually do not require medical attention (report to your care team if they continue or are bothersome): Flushing Headache Joint  pain Muscle pain Nausea Pain, redness, or irritation at injection site This list may not describe all possible side effects. Call your doctor for medical advice about side effects. You may report side effects to FDA at 1-800-FDA-1088. Where should I keep my medication? This medication is given in a hospital or clinic. It will not be stored at home. NOTE: This sheet is a summary. It may not cover all possible information. If you have questions about this medicine, talk to your doctor, pharmacist, or health care provider.  2024 Elsevier/Gold Standard (2023-06-06 00:00:00)      To help prevent nausea and vomiting after your treatment, we encourage you to take your nausea medication as directed.  BELOW ARE SYMPTOMS THAT SHOULD BE REPORTED IMMEDIATELY: *FEVER GREATER THAN 100.4 F (38 C) OR HIGHER *CHILLS OR SWEATING *NAUSEA AND VOMITING THAT IS NOT CONTROLLED WITH YOUR NAUSEA MEDICATION *UNUSUAL SHORTNESS OF BREATH *UNUSUAL BRUISING OR BLEEDING *URINARY PROBLEMS (pain or burning when urinating, or frequent urination) *BOWEL PROBLEMS (unusual diarrhea, constipation, pain near the anus) TENDERNESS IN MOUTH AND THROAT WITH OR WITHOUT PRESENCE OF ULCERS (sore throat, sores in mouth, or a toothache) UNUSUAL RASH, SWELLING OR PAIN  UNUSUAL VAGINAL DISCHARGE OR ITCHING   Items with * indicate a potential emergency and should be followed up as soon as possible or go to the Emergency Department if any problems should occur.  Please show the CHEMOTHERAPY ALERT CARD or IMMUNOTHERAPY ALERT CARD at check-in to the Emergency Department and triage nurse.  Should you have questions after your visit or need to cancel or reschedule your appointment, please contact [facility] [place] - A DEPT OF [facility] [phone]  and follow the prompts.  Office hours are 8:00 a.m. to 4:30 p.m. Monday - Friday. Please note that voicemails left after 4:00 p.m. may not be returned until the  following business day.  We are closed weekends and major holidays. You have access to a nurse at all times for urgent questions. Please call the main number to the clinic [phone] and follow the prompts.  For any non-urgent questions, you may also contact your provider using MyChart. We now offer e-Visits for anyone 49 and older to request care online for non-urgent symptoms. For details visit [email or web address].   Also download the MyChart app! Go to the app store, search "MyChart", open the app, select [facility], and log in with your MyChart username and password.

## 2024-03-21 ENCOUNTER — Encounter: Payer: Self-pay | Admitting: Oncology

## 2024-03-21 ENCOUNTER — Inpatient Hospital Stay (HOSPITAL_BASED_OUTPATIENT_CLINIC_OR_DEPARTMENT_OTHER): Admitting: Oncology

## 2024-03-21 VITALS — BP 138/96 | HR 95 | Temp 98.0°F | Resp 16 | Wt 198.2 lb

## 2024-03-21 DIAGNOSIS — E611 Iron deficiency: Secondary | ICD-10-CM | POA: Insufficient documentation

## 2024-03-21 DIAGNOSIS — D75839 Thrombocytosis, unspecified: Secondary | ICD-10-CM

## 2024-03-21 DIAGNOSIS — D72829 Elevated white blood cell count, unspecified: Secondary | ICD-10-CM | POA: Diagnosis not present

## 2024-03-21 MED ORDER — FERROUS SULFATE 324 MG PO TBEC: 324.0000 mg | DELAYED_RELEASE_TABLET | ORAL | 3 refills | Status: AC

## 2024-03-21 NOTE — Assessment & Plan Note (Signed)
 Likely reactive secondary to inflammation, mostly neutrophil predominance ANA, RF: Negative UA positive for leukocytes Uric acid elevated ESR: normal, CRP: Elevated Flow cytometry: Negative  - Continue to monitor counts

## 2024-03-21 NOTE — Patient Instructions (Signed)
 VISIT SUMMARY:  Today, we discussed your heavy menstrual bleeding, iron deficiency, elevated platelet count, and other health concerns. We reviewed your recent lab results and medical history, including your splenectomy and family history of colon cancer. We have outlined a plan to address each of these issues.  YOUR PLAN:  -IRON DEFICIENCY WITHOUT ANEMIA: You have severe iron deficiency, likely due to your heavy menstrual bleeding and splenectomy, but you do not have anemia. We will continue your oral iron supplements every other day and suggest taking them with orange juice while avoiding coffee. We will recheck your blood work in one month to see how you are responding to the iron therapy. If needed, we may consider iron infusions every three months.  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 We will monitor your platelet levels with your upcoming blood work. If your platelet count remains high after correcting your iron deficiency, we will test for a JAK2 mutation, which can help us  understand the cause better.  -HEAVY MENSTRUAL BLEEDING: Your heavy menstrual bleeding is contributing to your iron deficiency and may be worsened by your PCOS. We will refer you to a gynecologist for further evaluation and management of this issue.  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  -ELEVATED ESR WITH FIBROMYALGIA: Your elevated ESR (a marker of inflammation) is likely due to your fibromyalgia. We will continue to monitor your inflammatory markers and symptoms.  INSTRUCTIONS:  Please follow up with the blood work in one month to assess your response to the iron therapy and monitor your platelet levels. Additionally, schedule an appointment with a gynecologist for further evaluation and management of your heavy menstrual bleeding.

## 2024-03-21 NOTE — Progress Notes (Signed)
 [facility] at [facility]  HEMATOLOGY FOLLOW-UP VISIT  Center, [facility]  REASON FOR FOLLOW-UP: Leukocytosis and thrombocytosis  ASSESSMENT & PLAN:  Patient is a 43 y.o. 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 No anemia present. S/P IV iron infusion- 1g  Lack of symptomatic improvement post-iron infusion expected due to absence of anemia.  - Continue oral iron supplementation every other day.  RTC in 6 weeks with labs to assess for response  Addendum: Records obtained from [facility] regarding colonoscopy done on 09/03/2023: Impression: Normal terminal ileum, erosion in the ileocecal valve, polyp in the transverse colon, polyp in the rectum, small internal hemorrhoids.   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, rectal polyp: Hyperplastic polyp Repeat colonoscopy recommended in 5 years   Orders Placed This Encounter  Procedures   Ferritin    Standing Status:   Future    Expected Date:   04/21/2024    Expiration Date:   03/21/2025   Folate    Standing Status:   Future    Expected Date:   04/21/2024    Expiration Date:   03/21/2025   Vitamin B12    Standing Status:   Future    Expected  Date:   04/21/2024    Expiration Date:   03/21/2025   CBC with Differential/Platelet    Standing Status:   Future    Expected Date:   04/21/2024    Expiration Date:   03/21/2025   Comprehensive metabolic panel with GFR    Standing Status:   Future    Expected Date:   04/21/2024    Expiration Date:   03/21/2025   Iron and TIBC    Standing Status:   Future    Expected Date:   04/21/2024    Expiration Date:   03/21/2025   JAK2 V617 reflex CALR/MPL/E12-15    Standing Status:   Future    Expected Date:   04/21/2024    Expiration Date:   03/21/2025    The total time spent in the appointment was 20 minutes encounter with patients including review of chart and various tests results, discussions about plan of care and coordination of care plan   All questions were answered. The patient knows to call the clinic with any problems, questions or concerns. No barriers to learning was detected.  Eduardo Grade, MD 5/27/20251:38 PM   SUMMARY OF HEMATOLOGIC HISTORY: -Thrombocytosis: Likely secondary to splenectomy and iron deficiency  S/p IV monoferric  1g 03/14/2024 - Leucocytosis: ANA, RF: Negative UA positive for leukocytes Uric acid elevated ESR: normal, CRP: Elevated Flow cytometry: Negative  INTERVAL HISTORY: Dominique Torres 43 y.o. female following for leukocytosis and thrombocytosis. She experiences heavy menstrual bleeding, which she attributes to her PCOS. Her menstrual cycles occur every 30 days and last for 6 to 7 days. During  her heaviest bleeding days, she uses three pads and three to four tampons per day and sometimes has to wake up at night to change them. She occasionally notices small clots.  Her father had colon cancer, and she recently had a colonoscopy in November at [facility], where two polyps were found. Repeat suggested in 5 years. Denies fever, chills, weight loss, loss of appetite, night sweats and fatigue.   I have reviewed the past medical history, past surgical  history, social history and family history with the patient   ALLERGIES:  is allergic to gadolinium derivatives, iodinated contrast media, wellbutrin [bupropion], biaxin [clarithromycin], ciprofloxacin, cymbalta [duloxetine hcl], dexamethasone , erythromycin, gluten meal, lyrica [pregabalin], other, sitagliptin, sulfa antibiotics, tramadol, and shellfish-derived products.  MEDICATIONS:  Current Outpatient Medications  Medication Sig Dispense Refill   ALPRAZolam (XANAX) 1 MG tablet Take 1 mg by mouth 2 (two) times daily as needed.     amLODipine  (NORVASC ) 5 MG tablet TAKE 1 TABLET(5 MG) BY MOUTH DAILY 30 tablet 1   Cholecalciferol (VITAMIN D3) 1.25 MG (50000 UT) CAPS Take 1 capsule by mouth once a week.     cholecalciferol (VITAMIN D3) 25 MCG (1000 UT) tablet Take 1,000 Units by mouth daily.     Continuous Glucose Sensor (FREESTYLE LIBRE 14 DAY SENSOR) MISC SMARTSIG:1 Topical As Directed     Ergocalciferol 10 MCG (400 UNIT) TABS Take by mouth.     escitalopram (LEXAPRO) 5 MG tablet Take 5 mg by mouth daily.     ezetimibe (ZETIA) 10 MG tablet Take 10 mg by mouth daily.     gabapentin (NEURONTIN) 100 MG capsule Take 100 mg by mouth 3 (three) times daily.     metFORMIN (GLUCOPHAGE) 1000 MG tablet Take 1,000 mg by mouth 2 (two) times daily.     Potassium Chloride  ER 20 MEQ TBCR Take 1 tablet by mouth daily.     psyllium (METAMUCIL) 58.6 % packet Take 1 packet by mouth daily.     traZODone (DESYREL) 150 MG tablet Take 150 mg by mouth at bedtime as needed.     ferrous sulfate 324 MG TBEC Take 1 tablet (324 mg total) by mouth every other day. 30 tablet 3   No current facility-administered medications for this visit.     REVIEW OF SYSTEMS:   Constitutional: Denies fevers, chills or night sweats Eyes: Denies blurriness of vision Ears, nose, mouth, throat, and face: Denies mucositis or sore throat Respiratory: Denies cough, dyspnea or wheezes Cardiovascular: Denies palpitation, chest discomfort or  lower extremity swelling Gastrointestinal:  Denies nausea, heartburn or change in bowel habits Skin: Denies abnormal skin rashes Lymphatics: Denies new lymphadenopathy or easy bruising Neurological:Denies numbness, tingling or new weaknesses Behavioral/Psych: Mood is stable, no new changes  All other systems were reviewed with the patient and are negative.  PHYSICAL EXAMINATION:   Vitals:   03/21/24 0852 03/21/24 0857  BP: (!) 138/96 (!) 138/96  Pulse: 95   Resp: 16   Temp: 98 F (36.7 C)   SpO2: 98%     GENERAL:alert, no distress and comfortable SKIN: skin color, texture, turgor are normal, no rashes or significant lesions LYMPH:  no palpable lymphadenopathy in the cervical, axillary or inguinal LUNGS: clear to auscultation and percussion with normal breathing effort HEART: regular rate & rhythm and no murmurs and no lower extremity edema ABDOMEN:abdomen soft, non-tender and normal bowel sounds Musculoskeletal:no cyanosis of digits and no clubbing  NEURO: alert & oriented x 3 with fluent speech  LABORATORY DATA:  I have reviewed the data as listed  Lab Results  Component Value Date   WBC 11.6 (H) 03/07/2024   NEUTROABS 6.5 03/07/2024   HGB 12.3 03/07/2024   HCT 35.9 (L) 03/07/2024   MCV 88.9 03/07/2024   PLT 601 (H) 03/07/2024       Chemistry      Component Value Date/Time   NA 138 06/07/2018 1036   K 3.1 (L) 07/22/2018 1055   CL 95 (L) 06/07/2018 1036   CO2 31 06/07/2018 1036   BUN 11 06/07/2018 1036   CREATININE 0.69 06/07/2018 1036      Component Value Date/Time   CALCIUM  9.4 06/07/2018 1036   ALKPHOS 93 05/22/2018 1131   AST 22 05/22/2018 1131   ALT 18 05/22/2018 1131   BILITOT 0.5 05/22/2018 1131       Latest Reference Range & Units 03/07/24 10:09  LDH 98 - 192 U/L 116  Iron 28 - 170 ug/dL 30  UIBC ug/dL 161  TIBC 096 - 045 ug/dL 409  Saturation Ratios 10.4 - 31.8 % 7 (L)  Ferritin 11 - 307 ng/mL 5 (L)  CRP <1.0 mg/dL 0.8  (L): Data is  abnormally low   Latest Reference Range & Units 03/07/24 10:09  Sed Rate 0 - 22 mm/hr 35 (H)  (H): Data is abnormally high  Flow cytometry: DIAGNOSIS:  Peripheral blood, flow cytometry: -  No immunophenotypic evidence of a lymphoproliferative disorder (i.e. no monoclonal B cells or immunophenotypically abnormal T cells detected).  Note: It is noted that there as well as a thrombocytosis and mild lymphocytosis.  Upon morphologic smear review the white blood cells are overall unremarkable including both neutrophils and lymphocytes.  The red blood cells are unremarkable.  The platelet count is consistent with the smear with a mild amount of platelet clumping as well as larger platelets which may also abnormally decreased the overall increased platelet count.  Additional molecular testing as well as a bone marrow biopsy may be cared for further workup of the thrombocytosis.  GATING AND PHENOTYPIC ANALYSIS:  Gated population: Flow cytometric immunophenotyping is performed using antibodies to the antigens listed in the table below. Electronic gates are placed around a cell cluster displaying light scatter properties corresponding to: lymphocytes  Abnormal Cells in gated population: N/A  Phenotype of Abnormal Cells: N/A   RADIOGRAPHIC STUDIES: I have personally reviewed the radiological images as listed and agreed with the findings in the report.  US  PELVIS TRANSVANGINAL NON-OB (TV ONLY) CLINICAL DATA:  Dysmenorrhea.  EXAM: ULTRASOUND PELVIS TRANSVAGINAL  TECHNIQUE: Transvaginal ultrasound examination of the pelvis was performed including evaluation of the uterus, ovaries, adnexal regions, and pelvic cul-de-sac.  COMPARISON:  None.  FINDINGS: Uterus  Measurements: 8.3 x 4.3 x 5.4 cm = volume: 101.2 mL. No fibroids or other mass visualized.  Endometrium  Thickness: 16 mm.  No focal abnormality visualized.  Right ovary  Not visualized.  Left ovary  Measurements:  2.3 x 2.0 x 2.4 cm = volume: 5.7 mL. Normal appearance/no adnexal mass.  Other findings:  No abnormal free fluid  IMPRESSION: Endometrium measures 16 mm. If bleeding remains unresponsive to hormonal or medical therapy, focal lesion work-up with sonohysterogram should be considered. Endometrial biopsy should also be considered in pre-menopausal patients at high risk for endometrial carcinoma. (Ref: Radiological Reasoning: Algorithmic Workup of Abnormal Vaginal Bleeding with Endovaginal Sonography and Sonohysterography. AJR 2008; [id number]:[id number])  Electronically Signed   By: Jone Neither M.D.   On: 10/28/2018 11:07

## 2024-03-21 NOTE — Assessment & Plan Note (Signed)
 Severe iron deficiency with ferritin level of 5 and transferrin saturation ratio of 7, likely due to heavy menstrual bleeding . No anemia present. S/P IV iron infusion- 1g  Lack of symptomatic improvement post-iron infusion expected due to absence of anemia.  - Continue oral iron supplementation every other day.  RTC in 6 weeks with labs to assess for response  Addendum: Records obtained from [facility] regarding colonoscopy done on 09/03/2023: Impression: Normal terminal ileum, erosion in the ileocecal valve, polyp in the transverse colon, polyp in the rectum, small internal hemorrhoids.   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

## 2024-03-21 NOTE — Assessment & Plan Note (Signed)
 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

## 2024-03-26 ENCOUNTER — Inpatient Hospital Stay: Admitting: Licensed Clinical Social Worker

## 2024-03-31 ENCOUNTER — Inpatient Hospital Stay: Admitting: Dietician

## 2024-04-18 ENCOUNTER — Inpatient Hospital Stay: Attending: Oncology

## 2024-04-18 ENCOUNTER — Encounter: Payer: Self-pay | Admitting: Oncology

## 2024-04-18 DIAGNOSIS — D72829 Elevated white blood cell count, unspecified: Secondary | ICD-10-CM | POA: Insufficient documentation

## 2024-04-18 DIAGNOSIS — D75839 Thrombocytosis, unspecified: Secondary | ICD-10-CM | POA: Insufficient documentation

## 2024-04-18 DIAGNOSIS — Z9081 Acquired absence of spleen: Secondary | ICD-10-CM | POA: Diagnosis not present

## 2024-04-18 DIAGNOSIS — E611 Iron deficiency: Secondary | ICD-10-CM | POA: Diagnosis not present

## 2024-04-18 LAB — FERRITIN: Ferritin: 75 ng/mL (ref 11–307)

## 2024-04-18 LAB — CBC WITH DIFFERENTIAL/PLATELET
Abs Immature Granulocytes: 0.04 10*3/uL (ref 0.00–0.07)
Basophils Absolute: 0 10*3/uL (ref 0.0–0.1)
Basophils Relative: 0 %
Eosinophils Absolute: 0.1 10*3/uL (ref 0.0–0.5)
Eosinophils Relative: 1 %
HCT: 39.5 % (ref 36.0–46.0)
Hemoglobin: 13 g/dL (ref 12.0–15.0)
Immature Granulocytes: 0 %
Lymphocytes Relative: 35 %
Lymphs Abs: 4.2 10*3/uL — ABNORMAL HIGH (ref 0.7–4.0)
MCH: 30.3 pg (ref 26.0–34.0)
MCHC: 32.9 g/dL (ref 30.0–36.0)
MCV: 92.1 fL (ref 80.0–100.0)
Monocytes Absolute: 0.7 10*3/uL (ref 0.1–1.0)
Monocytes Relative: 6 %
Neutro Abs: 6.7 10*3/uL (ref 1.7–7.7)
Neutrophils Relative %: 58 %
Platelets: 487 10*3/uL — ABNORMAL HIGH (ref 150–400)
RBC: 4.29 MIL/uL (ref 3.87–5.11)
RDW: 16.1 % — ABNORMAL HIGH (ref 11.5–15.5)
WBC: 11.8 10*3/uL — ABNORMAL HIGH (ref 4.0–10.5)
nRBC: 0 % (ref 0.0–0.2)

## 2024-04-18 LAB — COMPREHENSIVE METABOLIC PANEL WITH GFR
ALT: 15 U/L (ref 0–44)
AST: 15 U/L (ref 15–41)
Albumin: 3.5 g/dL (ref 3.5–5.0)
Alkaline Phosphatase: 125 U/L (ref 38–126)
Anion gap: 12 (ref 5–15)
BUN: 11 mg/dL (ref 6–20)
CO2: 28 mmol/L (ref 22–32)
Calcium: 9.1 mg/dL (ref 8.9–10.3)
Chloride: 95 mmol/L — ABNORMAL LOW (ref 98–111)
Creatinine, Ser: 0.7 mg/dL (ref 0.44–1.00)
GFR, Estimated: >60 mL/min (ref >60)
Glucose, Bld: 309 mg/dL — ABNORMAL HIGH (ref 70–99)
Potassium: 3 mmol/L — ABNORMAL LOW (ref 3.5–5.1)
Sodium: 135 mmol/L (ref 135–145)
Total Bilirubin: 0.6 mg/dL (ref 0.0–1.2)
Total Protein: 7.6 g/dL (ref 6.5–8.1)

## 2024-04-18 LAB — IRON AND TIBC
Iron: 68 ug/dL (ref 28–170)
Saturation Ratios: 21 % (ref 10.4–31.8)
TIBC: 323 ug/dL (ref 250–450)
UIBC: 255 ug/dL

## 2024-04-18 LAB — FOLATE: Folate: >40 ng/mL (ref >5.9)

## 2024-04-18 LAB — VITAMIN B12: Vitamin B-12: 443 pg/mL (ref 180–914)

## 2024-04-24 LAB — JAK2 V617F RFX CALR/MPL/E12-15

## 2024-04-24 LAB — CALR +MPL + E12-E15  (REFLEX)

## 2024-04-25 ENCOUNTER — Inpatient Hospital Stay (HOSPITAL_BASED_OUTPATIENT_CLINIC_OR_DEPARTMENT_OTHER): Admitting: Oncology

## 2024-04-25 ENCOUNTER — Encounter: Payer: Self-pay | Admitting: Oncology

## 2024-04-25 VITALS — BP 153/93 | HR 95 | Temp 98.5°F | Resp 16 | Wt 197.8 lb

## 2024-04-25 DIAGNOSIS — D509 Iron deficiency anemia, unspecified: Secondary | ICD-10-CM

## 2024-04-25 DIAGNOSIS — D75839 Thrombocytosis, unspecified: Secondary | ICD-10-CM

## 2024-04-25 DIAGNOSIS — D72829 Elevated white blood cell count, unspecified: Secondary | ICD-10-CM

## 2024-04-25 DIAGNOSIS — E611 Iron deficiency: Secondary | ICD-10-CM

## 2024-04-25 NOTE — Assessment & Plan Note (Signed)
 Likely reactive secondary to inflammation, mostly neutrophil predominance.  Likely contributed by obesity as well.  No B symptoms ANA, RF: Negative UA positive for leukocytes Uric acid elevated ESR: Elevated, CRP: Normal Flow cytometry: Negative  - Continue to monitor counts

## 2024-04-25 NOTE — Assessment & Plan Note (Signed)
 Severe iron deficiency with ferritin level of 5 and transferrin saturation ratio of 7, likely due to heavy menstrual bleeding . No anemia present. S/P IV iron infusion- 1g.  Iron labs improved Records obtained from [facility] regarding colonoscopy done on 09/03/2023:  Impression: Normal terminal ileum, erosion in the ileocecal valve, polyp in the transverse colon, polyp in the rectum, small internal hemorrhoids.   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  Return to clinic in 3 months with labs.  At this time if iron labs are stable, leukocytosis and thrombocytosis stable, can consider discharging patient from hematology clinic to be followed up by PCP

## 2024-04-25 NOTE — Progress Notes (Signed)
 [facility] at [facility]  HEMATOLOGY FOLLOW-UP VISIT  Center, [facility]  REASON FOR FOLLOW-UP: Leukocytosis and thrombocytosis  ASSESSMENT & PLAN:  Patient is a 43 y.o. 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  Likely contributed by obesity as well.  No B symptoms ANA, RF: Negative UA positive for leukocytes Uric acid elevated ESR: Elevated, CRP: Normal Flow cytometry: Negative  - Continue to monitor counts    Iron deficiency Severe iron deficiency with ferritin level of 5 and transferrin saturation ratio of 7, likely due to heavy menstrual bleeding . No anemia present. S/P IV iron infusion- 1g.  Iron labs improved Records obtained from [facility] regarding colonoscopy done on 09/03/2023:  Impression: Normal terminal ileum, erosion in the ileocecal valve, polyp in the transverse colon, polyp in the rectum, small internal hemorrhoids.   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  Return to clinic in 3 months with labs.  At this time if iron labs are stable, leukocytosis and thrombocytosis stable, can consider discharging patient from hematology clinic to be followed up by PCP    Orders Placed This Encounter  Procedures   Ferritin    Standing Status:    Future    Expected Date:   07/21/2024    Expiration Date:   10/19/2024   Folate    Standing Status:   Future    Expected Date:   07/21/2024    Expiration Date:   10/19/2024   Vitamin B12    Standing Status:   Future    Expected Date:   07/21/2024    Expiration Date:   10/19/2024   CBC with Differential/Platelet    Standing Status:   Future    Expected Date:   07/21/2024    Expiration Date:   10/19/2024   Comprehensive metabolic panel with GFR    Standing Status:   Future    Expected Date:   07/21/2024    Expiration Date:   10/19/2024   Iron and TIBC    Standing Status:   Future    Expected Date:   07/21/2024    Expiration Date:   10/19/2024    The total time spent in the appointment was 20 minutes encounter with patients including review of chart and various tests results, discussions about plan of care and coordination of care plan   All questions were answered. The patient knows to call the clinic with any problems, questions or concerns. No barriers to learning was detected.  Mickiel Dry, MD 6/27/20252:19 PM   SUMMARY OF HEMATOLOGIC HISTORY: -Thrombocytosis: Likely secondary to splenectomy and iron deficiency  JAK2/MPL/CALR/E12-15: Negative  S/p IV monoferric  1g 03/14/2024  - Leucocytosis: ANA, RF: Negative UA positive for leukocytes Uric acid elevated ESR: normal, CRP: Elevated Flow cytometry: Negative  INTERVAL HISTORY: Dominique Torres 43 y.o. female following for leukocytosis and thrombocytosis.  She reports significantly feeling better after IV iron infusion.  She has  no other complaints today.  Denies fever, chills, weight loss, loss of appetite, night sweats and fatigue.   I have reviewed the past medical history, past surgical history, social history and family history with the patient   ALLERGIES:  is allergic to gadolinium derivatives, iodinated contrast media, wellbutrin [bupropion], biaxin [clarithromycin], ciprofloxacin, cymbalta [duloxetine hcl],  dexamethasone , erythromycin, gluten meal, lyrica [pregabalin], other, sitagliptin, sulfa antibiotics, tramadol, and shellfish-derived products.  MEDICATIONS:  Current Outpatient Medications  Medication Sig Dispense Refill   ALPRAZolam (XANAX) 1 MG tablet Take 1 mg by mouth 2 (two) times daily as needed.     amLODipine  (NORVASC ) 5 MG tablet TAKE 1 TABLET(5 MG) BY MOUTH DAILY 30 tablet 1   Cholecalciferol (VITAMIN D3) 1.25 MG (50000 UT) CAPS Take 1 capsule by mouth once a week.     Continuous Glucose Sensor (FREESTYLE LIBRE 14 DAY SENSOR) MISC SMARTSIG:1 Topical As Directed     escitalopram (LEXAPRO) 5 MG tablet Take 5 mg by mouth daily.     ezetimibe (ZETIA) 10 MG tablet Take 10 mg by mouth daily.     ferrous sulfate  324 MG TBEC Take 1 tablet (324 mg total) by mouth every other day. 30 tablet 3   metFORMIN (GLUCOPHAGE) 1000 MG tablet Take 1,000 mg by mouth 2 (two) times daily.     Potassium Chloride  ER 20 MEQ TBCR Take 1 tablet by mouth daily.     psyllium (METAMUCIL) 58.6 % packet Take 1 packet by mouth daily.     traZODone (DESYREL) 150 MG tablet Take 150 mg by mouth at bedtime as needed.     No current facility-administered medications for this visit.     REVIEW OF SYSTEMS:   Constitutional: Denies fevers, chills or night sweats Eyes: Denies blurriness of vision Ears, nose, mouth, throat, and face: Denies mucositis or sore throat Respiratory: Denies cough, dyspnea or wheezes Cardiovascular: Denies palpitation, chest discomfort or lower extremity swelling Gastrointestinal:  Denies nausea, heartburn or change in bowel habits Skin: Denies abnormal skin rashes Lymphatics: Denies new lymphadenopathy or easy bruising Neurological:Denies numbness, tingling or new weaknesses Behavioral/Psych: Mood is stable, no new changes  All other systems were reviewed with the patient and are negative.  PHYSICAL EXAMINATION:   Vitals:   04/25/24 1134  BP: (!) 153/93  Pulse: 95  Resp: 16  Temp:  98.5 F (36.9 C)  SpO2: 100%    GENERAL:alert, no distress and comfortable SKIN: skin color, texture, turgor are normal, no rashes or significant lesions LUNGS: clear to auscultation and percussion with normal breathing effort HEART: regular rate & rhythm and no murmurs and no lower extremity edema ABDOMEN:abdomen soft, non-tender and normal bowel sounds Musculoskeletal:no cyanosis of digits and no clubbing  NEURO: alert & oriented x 3 with fluent speech  LABORATORY DATA:  I have reviewed the data as listed  Lab Results  Component Value Date   WBC 11.8 (H) 04/18/2024   NEUTROABS 6.7 04/18/2024   HGB 13.0 04/18/2024   HCT 39.5 04/18/2024   MCV 92.1 04/18/2024   PLT 487 (H) 04/18/2024       Chemistry      Component Value Date/Time   NA 135 04/18/2024 1034   K 3.0 (L) 04/18/2024 1034   CL 95 (L) 04/18/2024 1034   CO2 28 04/18/2024 1034   BUN 11 04/18/2024 1034   CREATININE 0.70 04/18/2024 1034      Component Value Date/Time   CALCIUM  9.1 04/18/2024 1034   ALKPHOS 125 04/18/2024 1034  AST 15 04/18/2024 1034   ALT 15 04/18/2024 1034   BILITOT 0.6 04/18/2024 1034       Latest Reference Range & Units 03/07/24 10:09  LDH 98 - 192 U/L 116  Iron 28 - 170 ug/dL 30  UIBC ug/dL 616  TIBC 749 - 549 ug/dL 586  Saturation Ratios 10.4 - 31.8 % 7 (L)  Ferritin 11 - 307 ng/mL 5 (L)  CRP <1.0 mg/dL 0.8  (L): Data is abnormally low   Latest Reference Range & Units 03/07/24 10:09  Sed Rate 0 - 22 mm/hr 35 (H)  (H): Data is abnormally high  Latest Reference Range & Units 04/18/24 10:34  Iron 28 - 170 ug/dL 68  UIBC ug/dL 744  TIBC 749 - 549 ug/dL 676  Saturation Ratios 10.4 - 31.8 % 21  Ferritin 11 - 307 ng/mL 75  Folate >5.9 ng/mL >40.0  Vitamin B12 180 - 914 pg/mL 443    04/18/24 10:34  CALR +MPL + E12-E15 (reflexed) Negative  JAK2 V617F rfx CALR/MPL/E12-15 Negative  (C): Corrected Rpt: View report in Results Review for more information  Flow  cytometry: DIAGNOSIS:  Peripheral blood, flow cytometry: -  No immunophenotypic evidence of a lymphoproliferative disorder (i.e. no monoclonal B cells or immunophenotypically abnormal T cells detected).  Note: It is noted that there as well as a thrombocytosis and mild lymphocytosis.  Upon morphologic smear review the white blood cells are overall unremarkable including both neutrophils and lymphocytes.  The red blood cells are unremarkable.  The platelet count is consistent with the smear with a mild amount of platelet clumping as well as larger platelets which may also abnormally decreased the overall increased platelet count.  Additional molecular testing as well as a bone marrow biopsy may be cared for further workup of the thrombocytosis.  GATING AND PHENOTYPIC ANALYSIS:  Gated population: Flow cytometric immunophenotyping is performed using antibodies to the antigens listed in the table below. Electronic gates are placed around a cell cluster displaying light scatter properties corresponding to: lymphocytes  Abnormal Cells in gated population: N/A  Phenotype of Abnormal Cells: N/A   RADIOGRAPHIC STUDIES: I have personally reviewed the radiological images as listed and agreed with the findings in the report.  None new to review

## 2024-04-25 NOTE — Patient Instructions (Signed)
 VISIT SUMMARY:  Today, we reviewed your progress after your iron infusion therapy. You have experienced a significant increase in energy levels, and your lab results show improvement in your ferritin levels and a decrease in your platelet count. We also discussed your current medications and future lab tests.  YOUR PLAN:  -IRON DEFICIENCY ANEMIA: Iron deficiency anemia is a condition where your body lacks enough iron to produce healthy red blood cells. You have shown significant improvement after the iron infusion, with increased energy and higher ferritin levels. Please continue taking your oral iron supplements for the next three months.  -THROMBOCYTOSIS: Thrombocytosis is a condition where your body produces too many platelets. Your platelet count has decreased, which is a good sign. We will continue to monitor your platelet count and repeat your lab tests in three months.  -LEUKOCYTOSIS: Leukocytosis is a condition where your white blood cell count is higher than normal. Tests have ruled out leukemia, and the elevated count is likely due to inflammation and obesity. We will keep an eye on your white blood cell count and repeat your lab tests in three months.  INSTRUCTIONS:  Please continue taking your oral iron supplements for the next three months. We will repeat your lab tests in three months to monitor your platelet and white blood cell counts.

## 2024-04-25 NOTE — Assessment & Plan Note (Addendum)
 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

## 2024-06-09 ENCOUNTER — Encounter: Payer: Self-pay | Admitting: Oncology

## 2024-06-14 ENCOUNTER — Other Ambulatory Visit: Payer: Self-pay

## 2024-06-14 ENCOUNTER — Encounter (HOSPITAL_COMMUNITY): Payer: Self-pay

## 2024-06-14 ENCOUNTER — Emergency Department (HOSPITAL_COMMUNITY)
Admission: EM | Admit: 2024-06-14 | Discharge: 2024-06-14 | Disposition: A | Discharge status: Home / Self Care | Attending: Emergency Medicine | Admitting: Emergency Medicine

## 2024-06-14 DIAGNOSIS — E860 Dehydration: Secondary | ICD-10-CM | POA: Diagnosis not present

## 2024-06-14 DIAGNOSIS — E119 Type 2 diabetes mellitus without complications: Secondary | ICD-10-CM | POA: Insufficient documentation

## 2024-06-14 DIAGNOSIS — Z79899 Other long term (current) drug therapy: Secondary | ICD-10-CM | POA: Diagnosis not present

## 2024-06-14 DIAGNOSIS — R197 Diarrhea, unspecified: Secondary | ICD-10-CM | POA: Diagnosis not present

## 2024-06-14 DIAGNOSIS — R519 Headache, unspecified: Secondary | ICD-10-CM | POA: Diagnosis present

## 2024-06-14 DIAGNOSIS — E876 Hypokalemia: Secondary | ICD-10-CM | POA: Insufficient documentation

## 2024-06-14 DIAGNOSIS — R109 Unspecified abdominal pain: Secondary | ICD-10-CM | POA: Insufficient documentation

## 2024-06-14 DIAGNOSIS — I10 Essential (primary) hypertension: Secondary | ICD-10-CM | POA: Diagnosis not present

## 2024-06-14 DIAGNOSIS — Z7984 Long term (current) use of oral hypoglycemic drugs: Secondary | ICD-10-CM | POA: Diagnosis not present

## 2024-06-14 DIAGNOSIS — R112 Nausea with vomiting, unspecified: Secondary | ICD-10-CM | POA: Diagnosis not present

## 2024-06-14 LAB — URINALYSIS, ROUTINE W REFLEX MICROSCOPIC
Bacteria, UA: NONE SEEN
Bilirubin Urine: NEGATIVE
Glucose, UA: 150 mg/dL — AB
Hgb urine dipstick: NEGATIVE
Ketones, ur: 5 mg/dL — AB
Leukocytes,Ua: NEGATIVE
Nitrite: NEGATIVE
Protein, ur: 30 mg/dL — AB
Specific Gravity, Urine: 1.005 (ref 1.005–1.030)
pH: 7 (ref 5.0–8.0)

## 2024-06-14 LAB — COMPREHENSIVE METABOLIC PANEL WITH GFR
ALT: 12 U/L (ref 0–44)
AST: 15 U/L (ref 15–41)
Albumin: 3.7 g/dL (ref 3.5–5.0)
Alkaline Phosphatase: 110 U/L (ref 38–126)
Anion gap: 13 (ref 5–15)
BUN: 11 mg/dL (ref 6–20)
CO2: 27 mmol/L (ref 22–32)
Calcium: 9.6 mg/dL (ref 8.9–10.3)
Chloride: 93 mmol/L — ABNORMAL LOW (ref 98–111)
Creatinine, Ser: 0.78 mg/dL (ref 0.44–1.00)
GFR, Estimated: >60 mL/min (ref >60)
Glucose, Bld: 247 mg/dL — ABNORMAL HIGH (ref 70–99)
Potassium: 2.9 mmol/L — ABNORMAL LOW (ref 3.5–5.1)
Sodium: 133 mmol/L — ABNORMAL LOW (ref 135–145)
Total Bilirubin: 0.3 mg/dL (ref 0.0–1.2)
Total Protein: 8.5 g/dL — ABNORMAL HIGH (ref 6.5–8.1)

## 2024-06-14 LAB — CBC WITH DIFFERENTIAL/PLATELET
Abs Immature Granulocytes: 0.03 K/uL (ref 0.00–0.07)
Basophils Absolute: 0.1 K/uL (ref 0.0–0.1)
Basophils Relative: 1 %
Eosinophils Absolute: 0.1 K/uL (ref 0.0–0.5)
Eosinophils Relative: 1 %
HCT: 42.5 % (ref 36.0–46.0)
Hemoglobin: 14.6 g/dL (ref 12.0–15.0)
Immature Granulocytes: 0 %
Lymphocytes Relative: 36 %
Lymphs Abs: 3.9 K/uL (ref 0.7–4.0)
MCH: 31.5 pg (ref 26.0–34.0)
MCHC: 34.4 g/dL (ref 30.0–36.0)
MCV: 91.6 fL (ref 80.0–100.0)
Monocytes Absolute: 0.6 K/uL (ref 0.1–1.0)
Monocytes Relative: 6 %
Neutro Abs: 6.1 K/uL (ref 1.7–7.7)
Neutrophils Relative %: 56 %
Platelets: 580 K/uL — ABNORMAL HIGH (ref 150–400)
RBC: 4.64 MIL/uL (ref 3.87–5.11)
RDW: 13.4 % (ref 11.5–15.5)
WBC: 10.8 K/uL — ABNORMAL HIGH (ref 4.0–10.5)
nRBC: 0 % (ref 0.0–0.2)

## 2024-06-14 LAB — PREGNANCY, URINE: Preg Test, Ur: NEGATIVE

## 2024-06-14 MED ORDER — ONDANSETRON HCL 4 MG/2ML IJ SOLN
4.0000 mg | Freq: Once | INTRAMUSCULAR | Status: AC
  Administered 2024-06-14: 4 mg via INTRAVENOUS
  Filled 2024-06-14: qty 2

## 2024-06-14 MED ORDER — SODIUM CHLORIDE 0.9 % IV BOLUS
1000.0000 mL | Freq: Once | INTRAVENOUS | Status: AC
Start: 2024-06-14 — End: 2024-06-14
  Administered 2024-06-14: 1000 mL via INTRAVENOUS

## 2024-06-14 MED ORDER — ACETAMINOPHEN 500 MG PO TABS
1000.0000 mg | ORAL_TABLET | Freq: Once | ORAL | Status: AC
  Administered 2024-06-14: 1000 mg via ORAL
  Filled 2024-06-14: qty 2

## 2024-06-14 MED ORDER — POTASSIUM CHLORIDE CRYS ER 20 MEQ PO TBCR
40.0000 meq | EXTENDED_RELEASE_TABLET | Freq: Once | ORAL | Status: AC
Start: 2024-06-14 — End: 2024-06-14
  Administered 2024-06-14: 40 meq via ORAL
  Filled 2024-06-14: qty 2

## 2024-06-14 MED ORDER — IBUPROFEN 400 MG PO TABS
600.0000 mg | ORAL_TABLET | Freq: Once | ORAL | Status: AC
  Administered 2024-06-14: 600 mg via ORAL
  Filled 2024-06-14: qty 2

## 2024-06-14 NOTE — ED Provider Notes (Signed)
 [place] EMERGENCY DEPARTMENT AT [facility] Provider Note   CSN: [id number] Arrival date & time: 06/14/24  1139     Patient presents with: Hypertension (Headache, dizziness)   Dominique Torres is a 43 y.o. female.  History of hypokalemia, HTN, GERD, diabetes, anemia, bandemia.  Referred to ER for nausea vomiting diarrhea that started 5 days ago.  She has not vomited for the past 3 days but is still having watery stools twice a day though was volume this then when she initially got sick.  She says she still feeling lightheaded and having headaches and feels this is aftereffects of having a stomach virus or food poisoning.  She said she had eaten some beef from [employer] and got sick after eating it.  Abdominal pain, denies fever or chills, no chest pain or shortness of breath.  She went to urgent care this morning that has her COVID and flu which was negative as she was having high blood pressure at urgent care they wanted to be evaluated in the ER.  She denies numbness any weakness, no severe headache, no vision changes.    Hypertension       Prior to Admission medications   Medication Sig Start Date End Date Taking? Authorizing Provider  ALPRAZolam (XANAX) 1 MG tablet Take 1 mg by mouth 2 (two) times daily as needed.    [provider]  amLODipine  (NORVASC ) 5 MG tablet TAKE 1 TABLET(5 MG) BY MOUTH DAILY 06/10/18   Comer Kirsch, PA-C  Cholecalciferol (VITAMIN D3) 1.25 MG (50000 UT) CAPS Take 1 capsule by mouth once a week. 01/29/24   [provider]  Continuous Glucose Sensor (FREESTYLE LIBRE 14 DAY SENSOR) MISC SMARTSIG:1 Topical As Directed 11/26/23   [provider]  escitalopram (LEXAPRO) 5 MG tablet Take 5 mg by mouth daily.    [provider]  ezetimibe (ZETIA) 10 MG tablet Take 10 mg by mouth daily. 02/25/24   [provider]  ferrous sulfate  324 MG TBEC Take 1 tablet (324 mg total) by mouth every other day. 03/21/24    Kandala, Hyndavi, MD  metFORMIN (GLUCOPHAGE) 1000 MG tablet Take 1,000 mg by mouth 2 (two) times daily.    [provider]  Potassium Chloride  ER 20 MEQ TBCR Take 1 tablet by mouth daily. 01/31/24   [provider]  psyllium (METAMUCIL) 58.6 % packet Take 1 packet by mouth daily.    [provider]  traZODone (DESYREL) 150 MG tablet Take 150 mg by mouth at bedtime as needed. 02/14/24   [provider]    Allergies: Gadolinium derivatives, Iodinated contrast media, Wellbutrin [bupropion], Biaxin [clarithromycin], Ciprofloxacin, Cymbalta [duloxetine hcl], Dexamethasone , Erythromycin, Gluten meal, Lyrica [pregabalin], Other, Sitagliptin, Sulfa antibiotics, Tramadol, and Shellfish-derived products    Review of Systems  Updated Vital Signs BP (!) 159/111   Pulse 87   Temp 98.5 F (36.9 C)   Resp 18   Ht 5' 2 (1.575 m)   Wt 87.1 kg   SpO2 98%   BMI 35.12 kg/m   Physical Exam  (all labs ordered are listed, but only abnormal results are displayed) Labs Reviewed  CBC WITH DIFFERENTIAL/PLATELET - Abnormal; Notable for the following components:      Result Value   WBC 10.8 (*)    Platelets 580 (*)    All other components within normal limits  COMPREHENSIVE METABOLIC PANEL WITH GFR - Abnormal; Notable for the following components:   Sodium 133 (*)    Potassium 2.9 (*)  Chloride 93 (*)    Glucose, Bld 247 (*)    Total Protein 8.5 (*)    All other components within normal limits  URINALYSIS, ROUTINE W REFLEX MICROSCOPIC - Abnormal; Notable for the following components:   Color, Urine STRAW (*)    Glucose, UA 150 (*)    Ketones, ur 5 (*)    Protein, ur 30 (*)    All other components within normal limits  PREGNANCY, URINE    EKG: None  Radiology: No results found.   Procedures   Medications Ordered in the ED  ondansetron  (ZOFRAN ) injection 4 mg (4 mg Intravenous Given 06/14/24 1306)  sodium chloride  0.9 % bolus 1,000 mL (0 mLs Intravenous  Stopped 06/14/24 1510)  potassium chloride  SA (KLOR-CON  M) CR tablet 40 mEq (40 mEq Oral Given 06/14/24 1438)  acetaminophen  (TYLENOL ) tablet 1,000 mg (1,000 mg Oral Given 06/14/24 1500)  ibuprofen  (ADVIL ) tablet 600 mg (600 mg Oral Given 06/14/24 1504)                                    Medical Decision Making Differential diagnosis includes but limited to hypertension, dehydration, light disturbance, gastritis, gastroenteritis, colitis, other  Course: Patient is here for feeling generally unwell after having several days of nausea vomiting diarrhea this week.  She now able to tolerate oral fluids but still having diarrhea, though she states this has decreased significantly.  She is having a headache today and feels fatigued, so she went to urgent care and was referred to the ER for further evaluation, she did have blood pressure initially systolic 180, here blood pressure is somewhat improved.  She took her medication at home this morning.  She has no chest pain, no shortness of breath, no numbness tingling weakness, no vision changes, no severe headache.  Given IV fluids, labs were ordered and reviewed, she did have some ketones in her urine and protein with a few white blood cells but no bacteria nitrate or leukocytes, CMP with mild hyponatremia, potassium 2.9, glucose 247 and CBC with white blood cell count 10.8.  Is not pregnant.  Potassium was repleted orally, advised to double her home potassium for baseline hypokalemia for the next several days.  Advised on close PCP follow-up.  She is given IV fluids and nausea medicine.  She is feeling better and tolerating p.o.  Given Tylenol  ibuprofen  for headache.  I do not feel she needs further workup at this time.  Advised PCP follow-up and strict return precautions.  She is agree with plan of care and discharge.   Amount and/or Complexity of Data Reviewed Labs: ordered.  Risk OTC drugs. Prescription drug management.        Final  diagnoses:  Dehydration  Hypokalemia  Hypertension, unspecified type    ED Discharge Orders     None          Suellen Sherran DELENA DEVONNA 06/14/24 1514    Cleotilde Rogue, MD 06/15/24 [phone]

## 2024-06-14 NOTE — ED Triage Notes (Signed)
 Pt stated that she has been feeling dizzy, having a headache and her BP has been high. Pt seen at [facility] and was sent her. Pt neg for both Flu and covid

## 2024-06-14 NOTE — Discharge Instructions (Addendum)
 Is was pleasure taking care of you today.  You were seen in the ER for feeling unwell in the setting of recent nausea vomiting and diarrhea.  Your blood work showed low potassium so we repleted potassium and gave you IV fluids.  Your blood work was slightly elevated at 247.  Otherwise workup was reassuring.  Double your potassium at home for the next 3 to 4 days.  Drink plenty of fluids, rest, follow-up with your primary care doctor.  If you have new or worsening symptoms please come back to the ER right away.

## 2024-06-26 LAB — LAB REPORT - SCANNED: A1c: 9.8

## 2024-07-21 ENCOUNTER — Other Ambulatory Visit: Payer: Self-pay | Admitting: *Deleted

## 2024-07-25 ENCOUNTER — Inpatient Hospital Stay

## 2024-08-01 ENCOUNTER — Inpatient Hospital Stay: Admitting: Oncology

## 2024-08-06 ENCOUNTER — Inpatient Hospital Stay: Admitting: Oncology

## 2024-08-18 ENCOUNTER — Ambulatory Visit
Admission: RE | Admit: 2024-08-18 | Discharge: 2024-08-18 | Disposition: A | Discharge status: Home / Self Care | Source: Ambulatory Visit | Attending: Nurse Practitioner | Admitting: Nurse Practitioner

## 2024-08-18 ENCOUNTER — Ambulatory Visit

## 2024-08-18 VITALS — BP 161/102 | HR 106 | Temp 98.9°F | Resp 17

## 2024-08-18 DIAGNOSIS — Z113 Encounter for screening for infections with a predominantly sexual mode of transmission: Secondary | ICD-10-CM | POA: Insufficient documentation

## 2024-08-18 DIAGNOSIS — N898 Other specified noninflammatory disorders of vagina: Secondary | ICD-10-CM | POA: Insufficient documentation

## 2024-08-18 DIAGNOSIS — Z114 Encounter for screening for human immunodeficiency virus [HIV]: Secondary | ICD-10-CM | POA: Insufficient documentation

## 2024-08-18 NOTE — Discharge Instructions (Signed)
 Cytology swab and testing for HIV and syphilis are pending.  You will be contacted if the pending test results are abnormal.  You will also have access to your results via MyChart. Refrain from sexual intercourse until your test results are received. Please notify all partners if your test results are positive.  If your test results are positive and treatment is indicated, you will need to refrain from sexual intercourse for an additional 7 days after completing treatment. Increase condom use with each sexual encounter. Follow-up as needed.

## 2024-08-18 NOTE — ED Provider Notes (Signed)
 [facility]    CSN: [id number] Arrival date & time: 08/18/24  1024      History   Chief Complaint Chief Complaint  Patient presents with   SEXUALLY TRANSMITTED DISEASE    Need to be tested - Entered by patient    HPI Dominique Torres is a 43 y.o. female.   The history is provided by the patient.   Patient presents for complaints of vaginal itching.  Patient is requesting STI testing.  She states that she recently had unprotected sexual intercourse with a new partner.  She denies vaginal discharge, vaginal odor, vaginal itching, urinary frequency, urgency, hesitancy, fevers, chills, or abdominal pain.  Patient reports that she would like testing for HIV and syphilis as well.  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 Authorizing Provider  ALPRAZolam (XANAX) 1 MG tablet Take 1 mg by mouth 2 (two) times daily as needed.    [provider]  amLODipine  (NORVASC ) 5 MG  tablet TAKE 1 TABLET(5 MG) BY MOUTH DAILY 06/10/18   Comer Kirsch, PA-C  Cholecalciferol (VITAMIN D3) 1.25 MG (50000 UT) CAPS Take 1 capsule by mouth once a week. 01/29/24   [provider]  Continuous Glucose Sensor (FREESTYLE LIBRE 14 DAY SENSOR) MISC SMARTSIG:1 Topical As Directed 11/26/23   [provider]  escitalopram (LEXAPRO) 5 MG tablet Take 5 mg by mouth daily.    [provider]  ezetimibe (ZETIA) 10 MG tablet Take 10 mg by mouth daily. 02/25/24   [provider]  ferrous sulfate  324 MG TBEC Take 1 tablet (324 mg total) by mouth every other day. 03/21/24   Kandala, Hyndavi, MD  metFORMIN (GLUCOPHAGE) 1000 MG tablet Take 1,000 mg by mouth 2 (two) times daily.    [provider]  Potassium Chloride  ER 20 MEQ TBCR Take 1 tablet by mouth daily. 01/31/24   [provider]  psyllium (METAMUCIL) 58.6 % packet Take 1 packet by mouth daily.    [provider]  traZODone (DESYREL) 150 MG tablet Take 150 mg by mouth at bedtime as needed. 02/14/24   [provider]    Family History Family History  Problem Relation Age of Onset   Hypertension Mother    Hypercholesterolemia Mother    Colon polyps Mother    Diabetes Father    Colon cancer Father    Hypertension Maternal Aunt    Hyperlipidemia Maternal Aunt    Stroke Maternal Aunt 1   Diabetes Paternal Uncle    Diabetes Paternal Grandfather    Heart disease Maternal Grandmother        died at age 44   Stroke Maternal Grandfather        died in his 30s   Stroke Maternal Aunt 54       paralyzed from stroke    Social History Social History   Tobacco Use   Smoking status: Never   Smokeless tobacco: Never  Vaping Use   Vaping status: Never Used  Substance Use Topics   Alcohol use: No   Drug use: No     Allergies   Gadolinium derivatives, Iodinated contrast media, Wellbutrin [bupropion], Biaxin [clarithromycin], Ciprofloxacin, Cymbalta [duloxetine hcl],  Dexamethasone , Erythromycin, Gluten meal, Lyrica [pregabalin], Other, Sitagliptin, Sulfa antibiotics, Tramadol, and Shellfish protein-containing drug products   Review of Systems Review of Systems Per HPI  Physical Exam Triage Vital Signs ED Triage Vitals  Encounter Vitals Group     BP 08/18/24 1036 (!) 161/102     Girls Systolic BP Percentile --      Girls Diastolic BP Percentile --      Boys Systolic BP Percentile --      Boys Diastolic BP Percentile --      Pulse Rate 08/18/24 1036 (!) 106     Resp 08/18/24 1036 17     Temp 08/18/24 1036 98.9 F (37.2 C)     Temp Source 08/18/24 1036 Oral     SpO2 08/18/24 1036 95 %     Weight --      Height --      Head Circumference --      Peak Flow --      Pain Score 08/18/24 1035 0     Pain Loc --      Pain Education --      Exclude from Growth Chart --    No data found.  Updated Vital Signs BP (!) 161/102 (BP Location: Right Arm)   Pulse (!) 106   Temp 98.9 F (37.2 C) (Oral)   Resp 17   LMP 08/03/2024 (Exact Date)   SpO2 95%   Visual Acuity Right Eye Distance:   Left Eye Distance:   Bilateral Distance:    Right Eye Near:   Left Eye Near:    Bilateral Near:     Physical Exam Vitals and nursing note reviewed.  Constitutional:      General: She is not in acute distress.    Appearance: Normal appearance.  HENT:     Head: Normocephalic.  Eyes:     Extraocular Movements: Extraocular movements intact.     Pupils: Pupils are equal, round, and reactive to light.  Cardiovascular:     Rate and Rhythm: Normal rate and regular rhythm.     Pulses: Normal pulses.     Heart sounds: Normal heart sounds.  Pulmonary:     Effort: Pulmonary effort is normal. No respiratory distress.     Breath sounds: Normal breath sounds. No stridor. No wheezing, rhonchi or rales.  Abdominal:     General: Bowel sounds are normal.     Palpations: Abdomen is soft.  Genitourinary:    Comments: GU exam  deferred, self swab performed   Musculoskeletal:     Cervical back: Normal range of motion.  Neurological:     General: No focal deficit present.     Mental Status: She is alert and oriented to person, place, and time.  Psychiatric:        Mood and Affect: Mood normal.        Behavior: Behavior normal.      UC Treatments / Results  Labs (all labs ordered are listed, but only abnormal results are displayed) Labs Reviewed  RPR  HIV ANTIBODY (ROUTINE TESTING W REFLEX)  CERVICOVAGINAL ANCILLARY ONLY    EKG   Radiology No results found.  Procedures Procedures (including critical care time)  Medications Ordered in UC Medications - No data to display  Initial Impression / Assessment and Plan / UC Course  I have reviewed the triage vital signs and the nursing notes.  Pertinent labs & imaging results that were available during my care of the patient were reviewed by me and considered in my medical decision making (see chart for details).  Cytology swab and HIV and RPR test are pending.  Supportive care recommendations were provided and discussed with the patient to include refraining from sexual intercourse until test results are received, increasing condom use, notifying all partners if test results are positive, and refraining from sexual intercourse for 7 days after completing treatment if it is necessary.  Patient was advised if she continues to experience symptoms and all test are negative, recommend follow-up with her gynecologist for further evaluation.  Patient was in agreement with this plan of care and verbalizes understanding.  All questions were answered.  Patient stable for discharge.   Final Clinical Impressions(s) / UC Diagnoses   Final diagnoses:  Vaginal irritation  Screening for STD (sexually transmitted disease)  Encounter for screening for human immunodeficiency virus (HIV)     Discharge Instructions      Cytology swab and testing for HIV and syphilis are pending.  You will be  contacted if the pending test results are abnormal.  You will also have access to your results via MyChart. Refrain from sexual intercourse until your test results are received. Please notify all partners if your test results are positive.  If your test results are positive and treatment is indicated, you will need to refrain from sexual intercourse for an additional 7 days after completing treatment. Increase condom use with each sexual encounter. Follow-up as needed.     ED Prescriptions   None    PDMP not reviewed this encounter.   Gilmer Etta PARAS, NP 08/18/24 1124

## 2024-08-18 NOTE — ED Triage Notes (Signed)
 Pt present for STD testing. Pt states she recently had unprotected sex with a new partner. States she has vaginal irritation. Pt also wants to test for yeast infection.   She is requesting blood work.

## 2024-08-19 ENCOUNTER — Encounter: Payer: Self-pay | Admitting: Oncology

## 2024-08-19 ENCOUNTER — Inpatient Hospital Stay: Attending: Oncology | Admitting: Oncology

## 2024-08-19 VITALS — BP 155/103 | HR 99 | Temp 98.9°F | Resp 19 | Ht 62.0 in | Wt 197.0 lb

## 2024-08-19 DIAGNOSIS — D75839 Thrombocytosis, unspecified: Secondary | ICD-10-CM | POA: Diagnosis not present

## 2024-08-19 DIAGNOSIS — Z79899 Other long term (current) drug therapy: Secondary | ICD-10-CM | POA: Diagnosis not present

## 2024-08-19 DIAGNOSIS — E611 Iron deficiency: Secondary | ICD-10-CM | POA: Diagnosis not present

## 2024-08-19 DIAGNOSIS — D75838 Other thrombocytosis: Secondary | ICD-10-CM | POA: Diagnosis not present

## 2024-08-19 DIAGNOSIS — D72829 Elevated white blood cell count, unspecified: Secondary | ICD-10-CM | POA: Diagnosis not present

## 2024-08-19 LAB — HIV ANTIBODY (ROUTINE TESTING W REFLEX): HIV Screen 4th Generation wRfx: NONREACTIVE

## 2024-08-19 LAB — RPR: RPR Ser Ql: NONREACTIVE

## 2024-08-19 NOTE — Progress Notes (Signed)
 [facility] at [facility]  HEMATOLOGY FOLLOW-UP VISIT  Center, [facility]  REASON FOR FOLLOW-UP: Leukocytosis and thrombocytosis  ASSESSMENT & PLAN:  Patient is a 43 y.o. 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 - Continue oral iron every other day, use Miralax for constipation - Continue to monitor counts - Patient is reluctant to do labs today, concerned about previous bills from labs.   Leukocytosis Likely reactive secondary to inflammation, mostly neutrophil predominance.  Likely contributed by obesity as well.  No B symptoms ANA, RF: Negative UA positive for leukocytes Uric acid elevated ESR: Elevated, CRP: Normal Flow cytometry: Negative   - Stable from labs in 05/2024 - Continue to monitor counts  Iron deficiency Severe iron deficiency with ferritin level of 5 and transferrin saturation ratio of 7, likely due to heavy menstrual bleeding . No anemia present. S/P IV iron infusion- 1g.  Iron labs improved Records obtained from [facility] regarding colonoscopy done on 09/03/2023:   Impression: Normal terminal ileum, erosion in the ileocecal valve, polyp in the transverse colon, polyp in the rectum, small internal hemorrhoids.   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 -Patient is scheduled for uterine ablation soon.    Patient at this time wishes to return to clinic as needed.  As patient's counts are stable, can discharge to primary care with recommendations to have these levels checked every 6 months.  Recommended patient to reach out to our clinic in future if there is a significant elevation of platelets or white blood cells.  Also recommended to reach out in future for iron deficiency or any other needs.   No orders of the defined types were placed in this encounter.   The total time spent in the appointment was 20 minutes encounter with patients including review of chart and various tests results, discussions about plan of care and coordination of care plan  All questions were answered. The patient knows to call the clinic with any problems, questions or concerns. No barriers to learning was detected.  Mickiel Dry, MD 10/21/20258:40 AM   SUMMARY OF HEMATOLOGIC HISTORY: -Thrombocytosis: Likely secondary to splenectomy and iron deficiency  JAK2/MPL/CALR/E12-15: Negative  S/p IV monoferric  1g 03/14/2024  - Leucocytosis: ANA, RF: Negative UA positive for leukocytes Uric acid elevated ESR: normal, CRP: Elevated Flow cytometry: Negative  INTERVAL HISTORY: Dominique Torres 43 y.o. female following for leukocytosis and thrombocytosis.  She reports significantly feeling better.  She has no other complaints today.  She was in the ER in August after an episode of food poisoning with significant diarrhea and low potassium.  She is feeling much better now.  She has no complaints today.  She stated that she has been GYN for her uterine bleeding and is scheduled to get ablation done.  Denies fever, chills, weight loss, loss of appetite, night sweats and fatigue.  Patient reported that doing frequent labs expensive for her and then she  would like to avoid doing labs if possible.  I have reviewed the past medical history, past surgical  history, social history and family history with the patient   ALLERGIES:  is allergic to gadolinium derivatives, iodinated contrast media, wellbutrin [bupropion], biaxin [clarithromycin], ciprofloxacin, cymbalta [duloxetine hcl], dexamethasone , erythromycin, gluten meal, lyrica [pregabalin], other, sitagliptin, sulfa antibiotics, tramadol, and shellfish protein-containing drug products.  MEDICATIONS:  Current Outpatient Medications  Medication Sig Dispense Refill   allopurinol (ZYLOPRIM) 100 MG tablet Take 100 mg by mouth daily.     ALPRAZolam (XANAX) 1 MG tablet Take 1 mg by mouth 2 (two) times daily as needed.     amLODipine  (NORVASC ) 5 MG tablet TAKE 1 TABLET(5 MG) BY MOUTH DAILY 30 tablet 1   buPROPion (WELLBUTRIN XL) 150 MG 24 hr tablet Take 150 mg by mouth every morning.     Cholecalciferol (VITAMIN D3) 1.25 MG (50000 UT) CAPS Take 1 capsule by mouth once a week.     Continuous Glucose Sensor (FREESTYLE LIBRE 14 DAY SENSOR) MISC SMARTSIG:1 Topical As Directed     escitalopram (LEXAPRO) 5 MG tablet Take 5 mg by mouth daily.     ezetimibe (ZETIA) 10 MG tablet Take 10 mg by mouth daily.     ferrous sulfate  324 MG TBEC Take 1 tablet (324 mg total) by mouth every other day. 30 tablet 3   Insulin Glargine (BASAGLAR KWIKPEN) 100 UNIT/ML SMARTSIG:10 Unit(s) SUB-Q Every Evening     metFORMIN (GLUCOPHAGE) 1000 MG tablet Take 1,000 mg by mouth 2 (two) times daily.     ondansetron  (ZOFRAN -ODT) 8 MG disintegrating tablet Take 8 mg by mouth 3 (three) times daily.     Potassium Chloride  ER 20 MEQ TBCR Take 1 tablet by mouth daily.     psyllium (METAMUCIL) 58.6 % packet Take 1 packet by mouth daily.     sodium chloride  1 g tablet Take 1 g by mouth 3 (three) times daily as needed.     traZODone (DESYREL) 150 MG tablet Take 150 mg by mouth at bedtime as needed.     No current facility-administered medications for this visit.     REVIEW OF SYSTEMS:   Constitutional: Denies fevers, chills or night  sweats Eyes: Denies blurriness of vision Ears, nose, mouth, throat, and face: Denies mucositis or sore throat Respiratory: Denies cough, dyspnea or wheezes Cardiovascular: Denies palpitation, chest discomfort or lower extremity swelling Gastrointestinal:  Denies nausea, heartburn or change in bowel habits Skin: Denies abnormal skin rashes Lymphatics: Denies new lymphadenopathy or easy bruising Neurological:Denies numbness, tingling or new weaknesses Behavioral/Psych: Mood is stable, no new changes  All other systems were reviewed with the patient and are negative.  PHYSICAL EXAMINATION:   Vitals:   08/19/24 0808 08/19/24 0813  BP: (!) 152/106 (!) 155/103  Pulse: 99   Resp: 19   Temp: 98.9 F (37.2 C)   SpO2: 99%     GENERAL:alert, no distress and comfortable SKIN: skin color, texture, turgor are normal, no rashes or significant lesions LUNGS: clear to auscultation and percussion with normal breathing effort HEART: regular rate & rhythm and no murmurs and no lower extremity edema ABDOMEN:abdomen soft, non-tender and normal bowel sounds Musculoskeletal:no cyanosis of digits and no clubbing  NEURO: alert & oriented x 3 with fluent speech  LABORATORY DATA:  I have reviewed the data as listed  Reviewed labs from her primary care office done on 06/26/2024: CMP: Creatinine: 0.7, GFR: 88 Iron: 91, ferritin: 39.80, vitamin B12: 330, TIBC: 409 CBC:  Platelet: 531, WBC: 11.4, hemoglobin: 13.1,  Lab Results  Component Value Date   WBC 10.8 (H) 06/14/2024   NEUTROABS 6.1 06/14/2024   HGB 14.6 06/14/2024   HCT 42.5 06/14/2024   MCV 91.6 06/14/2024   PLT 580 (H) 06/14/2024       Chemistry      Component Value Date/Time   NA 133 (L) 06/14/2024 1254   K 2.9 (L) 06/14/2024 1254   CL 93 (L) 06/14/2024 1254   CO2 27 06/14/2024 1254   BUN 11 06/14/2024 1254   CREATININE 0.78 06/14/2024 1254      Component Value Date/Time   CALCIUM  9.6 06/14/2024 1254   ALKPHOS 110 06/14/2024  1254   AST 15 06/14/2024 1254   ALT 12 06/14/2024 1254   BILITOT 0.3 06/14/2024 1254       Latest Reference Range & Units 03/07/24 10:09  LDH 98 - 192 U/L 116  Iron 28 - 170 ug/dL 30  UIBC ug/dL 616  TIBC 749 - 549 ug/dL 586  Saturation Ratios 10.4 - 31.8 % 7 (L)  Ferritin 11 - 307 ng/mL 5 (L)  CRP <1.0 mg/dL 0.8  (L): Data is abnormally low   Latest Reference Range & Units 03/07/24 10:09  Sed Rate 0 - 22 mm/hr 35 (H)  (H): Data is abnormally high  Latest Reference Range & Units 04/18/24 10:34  Iron 28 - 170 ug/dL 68  UIBC ug/dL 744  TIBC 749 - 549 ug/dL 676  Saturation Ratios 10.4 - 31.8 % 21  Ferritin 11 - 307 ng/mL 75  Folate >5.9 ng/mL >40.0  Vitamin B12 180 - 914 pg/mL 443    04/18/24 10:34  CALR +MPL + E12-E15 (reflexed) Negative  JAK2 [id number] rfx CALR/MPL/E12-15 Negative  (C): Corrected Rpt: View report in Results Review for more information  Flow cytometry: DIAGNOSIS:  Peripheral blood, flow cytometry: -  No immunophenotypic evidence of a lymphoproliferative disorder (i.e. no monoclonal B cells or immunophenotypically abnormal T cells detected).  Note: It is noted that there as well as a thrombocytosis and mild lymphocytosis.  Upon morphologic smear review the white blood cells are overall unremarkable including both neutrophils and lymphocytes.  The red blood cells are unremarkable.  The platelet count is consistent with the smear with a mild amount of platelet clumping as well as larger platelets which may also abnormally decreased the overall increased platelet count.  Additional molecular testing as well as a bone marrow biopsy may be cared for further workup of the thrombocytosis.  GATING AND PHENOTYPIC ANALYSIS:  Gated population: Flow cytometric immunophenotyping is performed using antibodies to the antigens listed in the table below. Electronic gates are placed around a cell cluster displaying light scatter properties corresponding to:  lymphocytes  Abnormal Cells in gated population: N/A  Phenotype of Abnormal Cells: N/A   RADIOGRAPHIC STUDIES: I have personally reviewed the radiological images as listed and agreed with the findings in the report.  None relevant to review

## 2024-08-20 ENCOUNTER — Ambulatory Visit

## 2024-08-20 LAB — CERVICOVAGINAL ANCILLARY ONLY
Chlamydia: NEGATIVE
Comment: NEGATIVE
Comment: NEGATIVE
Comment: NEGATIVE
Comment: NEGATIVE
Comment: NEGATIVE
Comment: NORMAL
Neisseria Gonorrhea: NEGATIVE

## 2024-08-23 ENCOUNTER — Other Ambulatory Visit: Payer: Self-pay | Admitting: Oncology

## 2024-08-25 ENCOUNTER — Encounter: Payer: Self-pay | Admitting: Oncology

## 2024-08-26 ENCOUNTER — Encounter: Payer: Self-pay | Admitting: Oncology

## 2024-08-27 ENCOUNTER — Ambulatory Visit (INDEPENDENT_AMBULATORY_CARE_PROVIDER_SITE_OTHER)

## 2024-08-27 ENCOUNTER — Ambulatory Visit
Admission: RE | Admit: 2024-08-27 | Discharge: 2024-08-27 | Disposition: A | Discharge status: Home / Self Care | Source: Ambulatory Visit | Attending: Nurse Practitioner | Admitting: Nurse Practitioner

## 2024-08-27 ENCOUNTER — Telehealth: Payer: Self-pay | Admitting: Nurse Practitioner

## 2024-08-27 VITALS — BP 158/102 | HR 91 | Temp 98.7°F | Resp 16

## 2024-08-27 DIAGNOSIS — M25561 Pain in right knee: Secondary | ICD-10-CM

## 2024-08-27 DIAGNOSIS — Z8739 Personal history of other diseases of the musculoskeletal system and connective tissue: Secondary | ICD-10-CM | POA: Diagnosis not present

## 2024-08-27 MED ORDER — PREDNISONE 20 MG PO TABS: 40.0000 mg | ORAL_TABLET | Freq: Every day | ORAL | 0 refills | Status: DC

## 2024-08-27 MED ORDER — DEXAMETHASONE SOD PHOSPHATE PF 10 MG/ML IJ SOLN: 10.0000 mg | Freq: Once | INTRAMUSCULAR | Status: DC

## 2024-08-27 MED ORDER — DEXAMETHASONE SOD PHOSPHATE PF 10 MG/ML IJ SOLN
10.0000 mg | Freq: Once | INTRAMUSCULAR | Status: AC
  Administered 2024-08-27: 10 mg via INTRAMUSCULAR

## 2024-08-27 MED ORDER — MELOXICAM 7.5 MG PO TABS: 7.5000 mg | ORAL_TABLET | Freq: Every day | ORAL | 0 refills | Status: AC

## 2024-08-27 NOTE — ED Provider Notes (Addendum)
 [facility]    CSN: [id number] Arrival date & time: 08/27/24  9056      History   Chief Complaint Chief Complaint  Patient presents with   Knee Pain    Need an xray. Unusual exacerbated arthritic knee pain - Entered by patient    HPI Dominique Torres is a 43 y.o. female.   The history is provided by the patient.   Patient presents with a 5-day history of right knee pain.  Patient states symptoms started after her knee gave out.  She states that she has subsequently had pain since that time.  She states that she does have underlying history of fibromyalgia and has been dealing with inflammation.  She is concerned that that may be causing her symptoms.  She states she did have an x-ray of her knee performed by her PCP earlier this year and was diagnosed with osteoarthritis.  She denies swelling, inability to ambulate, numbness, tingling, radiation of pain, or bruising.  States she has been elevating the knee, and applying ice and heat as needed.  States that the pain is somewhat improved.  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 Authorizing Provider  predniSONE (DELTASONE) 20 MG tablet Take 2 tablets (40 mg total) by mouth daily with breakfast for 5 days. 08/27/24 09/01/24 Yes Leath-Warren, Etta PARAS, NP  allopurinol (ZYLOPRIM) 100 MG tablet Take 100 mg by mouth daily. 06/24/24   [provider]  ALPRAZolam (XANAX) 1 MG tablet Take 1 mg by mouth 2 (two) times daily as needed.    [provider]  amLODipine  (NORVASC ) 5 MG tablet TAKE 1 TABLET(5 MG) BY MOUTH DAILY 06/10/18   Comer Kirsch, PA-C  buPROPion (WELLBUTRIN XL) 150 MG 24 hr tablet Take 150 mg by mouth every morning. 07/25/24   [provider]  Cholecalciferol (VITAMIN D3) 1.25 MG (50000 UT) CAPS Take 1 capsule by mouth once a week. 01/29/24   [provider]  Continuous Glucose Sensor (FREESTYLE LIBRE 14 DAY SENSOR) MISC SMARTSIG:1 Topical As Directed 11/26/23   [provider]  escitalopram (LEXAPRO) 5 MG tablet Take 5 mg by mouth daily.  [provider]  ezetimibe (ZETIA) 10 MG tablet Take 10 mg by mouth daily. 02/25/24   [provider]  ferrous sulfate  324 MG TBEC Take 1 tablet (324 mg total) by mouth every other day. 03/21/24   Davonna Siad, MD  ferrous sulfate  325 (65 FE) MG EC tablet TAKE 1 TABLET BY MOUTH EVERY OTHER DAY 08/25/24   Kandala, Hyndavi, MD  Insulin Glargine (BASAGLAR KWIKPEN) 100 UNIT/ML SMARTSIG:10 Unit(s) SUB-Q Every Evening 08/01/24   [provider]  metFORMIN (GLUCOPHAGE) 1000 MG tablet Take 1,000 mg by mouth 2 (two) times daily.    [provider]  ondansetron  (ZOFRAN -ODT) 8 MG disintegrating tablet Take 8 mg by mouth 3 (three) times daily. 06/13/24   [provider]  Potassium Chloride  ER 20 MEQ TBCR Take 1 tablet by mouth daily. 01/31/24   [provider]  psyllium (METAMUCIL) 58.6 %  packet Take 1 packet by mouth daily.    [provider]  sodium chloride  1 g tablet Take 1 g by mouth 3 (three) times daily as needed. 06/17/24   [provider]  traZODone (DESYREL) 150 MG tablet Take 150 mg by mouth at bedtime as needed. 02/14/24   [provider]    Family History Family History  Problem Relation Age of Onset   Hypertension Mother    Hypercholesterolemia Mother    Colon polyps Mother    Diabetes Father    Colon cancer Father    Hypertension Maternal Aunt    Hyperlipidemia Maternal Aunt    Stroke Maternal Aunt 41   Diabetes Paternal Uncle    Diabetes Paternal Grandfather    Heart disease Maternal Grandmother        died at age 39   Stroke Maternal Grandfather        died in his 30s   Stroke Maternal Aunt 54       paralyzed from stroke    Social History Social History   Tobacco Use   Smoking status: Never   Smokeless tobacco: Never  Vaping Use   Vaping status: Never Used  Substance Use Topics   Alcohol use: No   Drug use: No     Allergies   Gadolinium derivatives, Iodinated contrast media, Wellbutrin [bupropion], Biaxin [clarithromycin], Ciprofloxacin, Cymbalta [duloxetine hcl], Dexamethasone , Erythromycin, Gluten meal, Lyrica [pregabalin], Other, Sitagliptin, Sulfa antibiotics, Tramadol, and Shellfish protein-containing drug products   Review of Systems Review of Systems Per HPI  Physical Exam Triage Vital Signs ED Triage Vitals  Encounter Vitals Group     BP 08/27/24 1021 (!) 158/102     Girls Systolic BP Percentile --      Girls Diastolic BP Percentile --      Boys Systolic BP Percentile --      Boys Diastolic BP Percentile --      Pulse Rate 08/27/24 1021 91     Resp 08/27/24 1021 16     Temp 08/27/24 1021 98.7 F (37.1 C)     Temp Source 08/27/24 1021 Oral     SpO2 08/27/24 1021 94 %     Weight --      Height --      Head Circumference --      Peak Flow --      Pain Score 08/27/24 1027 7     Pain Loc  --      Pain Education --      Exclude from Growth Chart --    No data found.  Updated Vital  Signs BP (!) 158/102 (BP Location: Right Arm)   Pulse 91   Temp 98.7 F (37.1 C) (Oral)   Resp 16   LMP 08/03/2024 (Exact Date)   SpO2 94%   Visual Acuity Right Eye Distance:   Left Eye Distance:   Bilateral Distance:    Right Eye Near:   Left Eye Near:    Bilateral Near:     Physical Exam Vitals and nursing note reviewed.  Constitutional:      General: She is not in acute distress.    Appearance: Normal appearance.  HENT:     Head: Normocephalic.  Eyes:     Extraocular Movements: Extraocular movements intact.     Pupils: Pupils are equal, round, and reactive to light.  Pulmonary:     Effort: Pulmonary effort is normal.  Musculoskeletal:     Cervical back: Normal range of motion.     Right knee: No swelling, deformity, effusion, ecchymosis or bony tenderness. Decreased range of motion. Tenderness present over the medial joint line. Normal pulse.  Skin:    General: Skin is warm and dry.  Neurological:     General: No focal deficit present.     Mental Status: She is alert and oriented to person, place, and time.  Psychiatric:        Mood and Affect: Mood normal.        Behavior: Behavior normal.      UC Treatments / Results  Labs (all labs ordered are listed, but only abnormal results are displayed) Labs Reviewed - No data to display  EKG   Radiology No results found.  Procedures Procedures (including critical care time)  Medications Ordered in UC Medications  dexamethasone  (DECADRON ) injection 10 mg (has no administration in time range)    Initial Impression / Assessment and Plan / UC Course  I have reviewed the triage vital signs and the nursing notes.  Pertinent labs & imaging results that were available during my care of the patient were reviewed by me and considered in my medical decision making (see chart for details).  X-ray of the right knee is  pending.  On exam, the patient does not have any obvious swelling, bruising, or deformity present.  It is likely that patient may have a flare of her osteoarthritis.  Decadron  10 mg IM was administered for inflammation.  Will start patient on meloxicam 7.5 mg as an anti-inflammatory.  A knee sleeve was provided to also provide additional compression and support.  Supportive care recommendations were provided and discussed with the patient to include RICE therapy, and continuing over-the-counter Tylenol  for pain or discomfort.  Discussed indications with patient regarding follow-up.  Patient was in agreement with this plan of care and verbalizes understanding.  All questions were answered.  Patient stable for discharge.  Work note was provided.  Final Clinical Impressions(s) / UC Diagnoses   Final diagnoses:  Right knee pain, unspecified chronicity  History of osteoarthritis     Discharge Instructions      The x-ray result is pending.  You will be contacted when the result of the x-ray is received.  You will also have access to the results via MyChart. You were given an injection of Decadron  10 mg.  Start the prednisone tomorrow. You may continue over-the-counter Tylenol  as needed for pain or general discomfort. RICE therapy, rest, ice, compression, and elevation.  Apply ice for 20 minutes, remove for 1 hour, repeat as needed. Continue to find ways to decrease inflammation. If  your symptoms fail to improve, or begin to worsen, it is recommended that you follow-up with orthopedics for further evaluation. Follow-up as needed.      ED Prescriptions     Medication Sig Dispense Auth. Provider   predniSONE (DELTASONE) 20 MG tablet Take 2 tablets (40 mg total) by mouth daily with breakfast for 5 days. 10 tablet Leath-Warren, Etta PARAS, NP      PDMP not reviewed this encounter.   Gilmer Etta PARAS, NP 08/27/24 1134    Leath-Warren, Etta PARAS, NP 08/27/24 1134     Leath-Warren, Etta PARAS, NP 08/27/24 1146    Leath-Warren, Etta PARAS, NP 08/27/24 1229

## 2024-08-27 NOTE — Telephone Encounter (Signed)
 Received patient's x-ray results of the right knee.  Called patient, verified patient identification with 2 patient identifiers.  Patient was advised there was no fracture or dislocation of the right knee.  Patient was advised there were degenerative changes of the medial and patellofemoral compartments.  Patient was also noted to have calcification along the medial femoral condyle.  X-ray also shows tendinopathy on the x-ray.  Patient was advised we will continue with current treatment recommendations along with continuing the anti-inflammatory she was prescribed and using the compression sleeve.  Patient was advised if symptoms fail to improve, or begin to worsen, recommend follow-up with orthopedics for further evaluation.  Patient was in agreement with this plan of care and verbalized understanding.  All questions were answered.

## 2024-08-27 NOTE — ED Triage Notes (Signed)
 Pt states she is having pain in her right knee that started Friday. That is worse with movement. Pt would like to have an x-ray if possible. No recent falls or injuries.

## 2024-08-27 NOTE — Discharge Instructions (Addendum)
 The x-ray result is pending.  You will be contacted when the result of the x-ray is received.  You will also have access to the results via MyChart. You were given an injection of Decadron  10 mg.  Start the prednisone tomorrow. You may continue over-the-counter Tylenol  as needed for pain or general discomfort. RICE therapy, rest, ice, compression, and elevation.  Apply ice for 20 minutes, remove for 1 hour, repeat as needed. Continue to find ways to decrease inflammation. If your symptoms fail to improve, or begin to worsen, it is recommended that you follow-up with orthopedics for further evaluation. Follow-up as needed.

## 2024-08-28 ENCOUNTER — Ambulatory Visit (HOSPITAL_COMMUNITY): Payer: Self-pay

## 2024-08-31 ENCOUNTER — Other Ambulatory Visit: Payer: Self-pay

## 2024-08-31 ENCOUNTER — Encounter (HOSPITAL_COMMUNITY): Payer: Self-pay

## 2024-08-31 ENCOUNTER — Emergency Department (HOSPITAL_COMMUNITY)
Admission: EM | Admit: 2024-08-31 | Discharge: 2024-08-31 | Disposition: A | Discharge status: Home / Self Care | Attending: Emergency Medicine | Admitting: Emergency Medicine

## 2024-08-31 DIAGNOSIS — M25561 Pain in right knee: Secondary | ICD-10-CM | POA: Insufficient documentation

## 2024-08-31 MED ORDER — OXYCODONE-ACETAMINOPHEN 5-325 MG PO TABS: 1.0000 | ORAL_TABLET | ORAL | 0 refills | Status: AC | PRN

## 2024-08-31 MED ORDER — OXYCODONE-ACETAMINOPHEN 5-325 MG PO TABS
1.0000 | ORAL_TABLET | Freq: Once | ORAL | Status: AC
  Administered 2024-08-31: 1 via ORAL
  Filled 2024-08-31: qty 1

## 2024-08-31 MED ORDER — OXYCODONE-ACETAMINOPHEN 5-325 MG PO TABS: 1.0000 | ORAL_TABLET | ORAL | 0 refills | Status: DC | PRN

## 2024-08-31 NOTE — ED Triage Notes (Signed)
 Pt went to Urgent care last week and was diagnosed with tendonopathy. Pt was given meloxicam which helped the pain and movement. Saturday the pain came back and got worse to now she can not walk, bend her leg/knee. Pain rated 10/10 non stop throbbing.

## 2024-08-31 NOTE — ED Notes (Signed)
 EDP at bedside

## 2024-08-31 NOTE — ED Provider Notes (Signed)
 [place] EMERGENCY DEPARTMENT AT [facility] Provider Note   CSN: [id number] Arrival date & time: 08/31/24  9665     Patient presents with: Knee Pain   Dominique Torres is a 43 y.o. female.   Presents to the emergency department for evaluation of right knee pain.  Patient reports that she began to have pain earlier in the week in the right knee.  She was seen in urgent care and told that it was likely soft tissue, tendinitis.  She was placed on an NSAID and she reports symptoms did significantly improve.  Tonight, however, she tried to get up and felt a pop in the knee and now is extremely uncomfortable deep inside the knee.       Prior to Admission medications   Medication Sig Start Date End Date Taking? Authorizing Provider  oxyCODONE -acetaminophen  (PERCOCET) 5-325 MG tablet Take 1-2 tablets by mouth every 4 (four) hours as needed. 08/31/24  Yes Aquanetta Schwarz, Lonni PARAS, MD  oxyCODONE -acetaminophen  (PERCOCET/ROXICET) 5-325 MG tablet Take 1 tablet by mouth every 4 (four) hours as needed for severe pain (pain score 7-10). 08/31/24  Yes Nahshon Reich, Lonni PARAS, MD  allopurinol (ZYLOPRIM) 100 MG tablet Take 100 mg by mouth daily. 06/24/24   [provider]  ALPRAZolam (XANAX) 1 MG tablet Take 1 mg by mouth 2 (two) times daily as needed.    [provider]  amLODipine  (NORVASC ) 5 MG tablet TAKE 1 TABLET(5 MG) BY MOUTH DAILY 06/10/18   Comer Kirsch, PA-C  buPROPion (WELLBUTRIN XL) 150 MG 24 hr tablet Take 150 mg by mouth every morning. 07/25/24   [provider]  Cholecalciferol (VITAMIN D3) 1.25 MG (50000 UT) CAPS Take 1 capsule by mouth once a week. 01/29/24   [provider]  Continuous Glucose Sensor (FREESTYLE LIBRE 14 DAY SENSOR) MISC SMARTSIG:1 Topical As Directed 11/26/23   [provider]  escitalopram (LEXAPRO) 5 MG tablet Take 5 mg by mouth daily.    [provider]  ezetimibe (ZETIA) 10 MG tablet Take 10 mg by mouth  daily. 02/25/24   [provider]  ferrous sulfate  324 MG TBEC Take 1 tablet (324 mg total) by mouth every other day. 03/21/24   Davonna Siad, MD  ferrous sulfate  325 (65 FE) MG EC tablet TAKE 1 TABLET BY MOUTH EVERY OTHER DAY 08/25/24   Kandala, Hyndavi, MD  Insulin Glargine (BASAGLAR KWIKPEN) 100 UNIT/ML SMARTSIG:10 Unit(s) SUB-Q Every Evening 08/01/24   [provider]  meloxicam (MOBIC) 7.5 MG tablet Take 1 tablet (7.5 mg total) by mouth daily. 08/27/24 09/26/24  Leath-Warren, Etta PARAS, NP  metFORMIN (GLUCOPHAGE) 1000 MG tablet Take 1,000 mg by mouth 2 (two) times daily.    [provider]  ondansetron  (ZOFRAN -ODT) 8 MG disintegrating tablet Take 8 mg by mouth 3 (three) times daily. 06/13/24   [provider]  Potassium Chloride  ER 20 MEQ TBCR Take 1 tablet by mouth daily. 01/31/24   [provider]  psyllium (METAMUCIL) 58.6 % packet Take 1 packet by mouth daily.    [provider]  sodium chloride  1 g tablet Take 1 g by mouth 3 (three) times daily as needed. 06/17/24   [provider]  traZODone (DESYREL) 150 MG tablet Take 150 mg by mouth at bedtime as needed. 02/14/24   [provider]    Allergies: Gadolinium derivatives, Iodinated contrast media, Prednisone, Wellbutrin [bupropion], Biaxin [clarithromycin], Ciprofloxacin, Cymbalta [duloxetine hcl], Dexamethasone , Erythromycin, Gluten meal, Lyrica [pregabalin], Other, Sitagliptin, Sulfa antibiotics, Tramadol, and  Shellfish protein-containing drug products    Review of Systems  Updated Vital Signs BP (!) 165/129   Pulse 82   Temp 97.6 F (36.4 C) (Oral)   Resp 20   Ht 5' 2 (1.575 m)   Wt 89.3 kg   LMP 08/03/2024 (Exact Date)   SpO2 100%   BMI 36.01 kg/m   Physical Exam Vitals and nursing note reviewed.  Constitutional:      Appearance: Normal appearance.  HENT:     Head: Atraumatic.  Cardiovascular:     Pulses:          Dorsalis pedis pulses are 2+ on  the right side.  Musculoskeletal:     Right knee: No swelling, deformity, effusion or erythema. Decreased range of motion. Tenderness present over the medial joint line.  Neurological:     Mental Status: She is alert.     (all labs ordered are listed, but only abnormal results are displayed) Labs Reviewed - No data to display  EKG: None  Radiology: No results found.   Procedures   Medications Ordered in the ED  oxyCODONE -acetaminophen  (PERCOCET/ROXICET) 5-325 MG per tablet 1 tablet (has no administration in time range)                                    Medical Decision Making Risk Prescription drug management.   Presents with acute worsening of right knee pain.  Patient was at urgent care earlier in the week, had an x-ray that showed mild degenerative changes and quadriceps tendinopathy.  These, however, are likely chronic findings.  Patient's current presentation is concerning for possible meniscus tear.  Examination does not raise any concern for ischemic limb.  Patient has distal pulses.  Likewise she is not at risk for infection and there are no concerning features that would suggest septic arthritis.  There is no palpable effusion, no warmth or erythema of the knee.  Pain occurred suddenly with movement of the knee indicating structural abnormality.  No obvious instability on exam.  Patient will be treated with analgesia, knee immobilizer, crutches, follow-up with orthopedics.     Final diagnoses:  Acute pain of right knee    ED Discharge Orders          Ordered    oxyCODONE -acetaminophen  (PERCOCET/ROXICET) 5-325 MG tablet  Every 4 hours PRN        08/31/24 0402    oxyCODONE -acetaminophen  (PERCOCET) 5-325 MG tablet  Every 4 hours PRN        08/31/24 0402               Kaamil Morefield J, MD 08/31/24 [phone]
